# Patient Record
Sex: Female | Born: 1970 | Hispanic: No | Marital: Single | Smoking: Never smoker
Health system: Southern US, Community
[De-identification: ages and names within clinical notes are randomized; demographics above are authoritative.]

## PROBLEM LIST (undated history)

## (undated) DIAGNOSIS — I10 Essential (primary) hypertension: Secondary | ICD-10-CM

## (undated) DIAGNOSIS — IMO0002 Reserved for concepts with insufficient information to code with codable children: Secondary | ICD-10-CM

## (undated) DIAGNOSIS — L409 Psoriasis, unspecified: Secondary | ICD-10-CM

## (undated) DIAGNOSIS — R87619 Unspecified abnormal cytological findings in specimens from cervix uteri: Secondary | ICD-10-CM

## (undated) HISTORY — DX: Essential (primary) hypertension: I10

## (undated) HISTORY — DX: Unspecified abnormal cytological findings in specimens from cervix uteri: R87.619

## (undated) HISTORY — PX: KNEE SURGERY: SHX244

## (undated) HISTORY — PX: CRYOTHERAPY: SHX1416

## (undated) HISTORY — DX: Psoriasis, unspecified: L40.9

## (undated) HISTORY — DX: Reserved for concepts with insufficient information to code with codable children: IMO0002

---

## 2007-05-13 ENCOUNTER — Other Ambulatory Visit: Admission: RE | Admit: 2007-05-13 | Discharge: 2007-05-13 | Payer: Self-pay | Admitting: Internal Medicine

## 2008-05-13 ENCOUNTER — Other Ambulatory Visit: Admission: RE | Admit: 2008-05-13 | Discharge: 2008-05-13 | Payer: Self-pay | Admitting: Obstetrics & Gynecology

## 2013-05-10 ENCOUNTER — Other Ambulatory Visit: Payer: Self-pay | Admitting: Certified Nurse Midwife

## 2013-05-10 NOTE — Telephone Encounter (Signed)
AEX 06/02/13 #[id number] sent through to last pt until next AEX

## 2013-06-01 ENCOUNTER — Encounter: Payer: Self-pay | Admitting: Certified Nurse Midwife

## 2013-06-02 ENCOUNTER — Ambulatory Visit: Payer: Self-pay | Admitting: Certified Nurse Midwife

## 2013-06-02 ENCOUNTER — Encounter: Payer: Self-pay | Admitting: Certified Nurse Midwife

## 2013-06-02 DIAGNOSIS — Z01419 Encounter for gynecological examination (general) (routine) without abnormal findings: Secondary | ICD-10-CM

## 2013-08-03 ENCOUNTER — Encounter (INDEPENDENT_AMBULATORY_CARE_PROVIDER_SITE_OTHER): Payer: BC Managed Care – PPO

## 2013-08-03 DIAGNOSIS — M7989 Other specified soft tissue disorders: Secondary | ICD-10-CM

## 2013-08-03 DIAGNOSIS — M79609 Pain in unspecified limb: Secondary | ICD-10-CM

## 2013-08-09 ENCOUNTER — Other Ambulatory Visit: Payer: Self-pay | Admitting: Certified Nurse Midwife

## 2013-08-10 MED ORDER — NORGESTIM-ETH ESTRAD TRIPHASIC 0.18/0.215/0.25 MG-35 MCG PO TABS: 1.0000 | ORAL_TABLET | Freq: Every day | ORAL | Status: DC

## 2013-08-10 NOTE — Telephone Encounter (Signed)
Patient called back scheduled AEX 08/18/13 @ 9:15 Tri-Previfem #[id number] sent to last patient until AEX

## 2013-08-10 NOTE — Addendum Note (Signed)
Addended by: Lorraine Lax on: 08/10/2013 10:21 AM   Modules accepted: Orders

## 2013-08-10 NOTE — Telephone Encounter (Signed)
Patient needs call, before renewal

## 2013-08-10 NOTE — Telephone Encounter (Signed)
LM on patient's VM that she is due for her AEX and she needs to call us back to schedule.

## 2013-08-10 NOTE — Telephone Encounter (Signed)
Agree 

## 2013-08-10 NOTE — Telephone Encounter (Signed)
Please advise patient D[facility] last AEX appointment 06/02/13, no current AEX scheduled.

## 2013-08-18 ENCOUNTER — Encounter: Payer: Self-pay | Admitting: Certified Nurse Midwife

## 2013-08-18 ENCOUNTER — Ambulatory Visit (INDEPENDENT_AMBULATORY_CARE_PROVIDER_SITE_OTHER): Payer: BC Managed Care – PPO | Admitting: Certified Nurse Midwife

## 2013-08-18 VITALS — BP 112/70 | HR 68 | Resp 16 | Ht 67.5 in | Wt 253.0 lb

## 2013-08-18 DIAGNOSIS — Z01419 Encounter for gynecological examination (general) (routine) without abnormal findings: Secondary | ICD-10-CM

## 2013-08-18 MED ORDER — NORGESTIM-ETH ESTRAD TRIPHASIC 0.18/0.215/0.25 MG-35 MCG PO TABS: 1.0000 | ORAL_TABLET | Freq: Every day | ORAL | Status: DC

## 2013-08-18 NOTE — Patient Instructions (Signed)
General topics  Next pap or exam is  due in 1 year Take a Women's multivitamin Take 1200 mg. of calcium daily - prefer dietary If any concerns in interim to call back  Breast Self-Awareness Practicing breast self-awareness may pick up problems early, prevent significant medical complications, and possibly save your life. By practicing breast self-awareness, you can become familiar with how your breasts look and feel and if your breasts are changing. This allows you to notice changes early. It can also offer you some reassurance that your breast health is good. One way to learn what is normal for your breasts and whether your breasts are changing is to do a breast self-exam. If you find a lump or something that was not present in the past, it is best to contact your caregiver right away. Other findings that should be evaluated by your caregiver include nipple discharge, especially if it is bloody; skin changes or reddening; areas where the skin seems to be pulled in (retracted); or new lumps and bumps. Breast pain is seldom associated with cancer (malignancy), but should also be evaluated by a caregiver. BREAST SELF-EXAM The best time to examine your breasts is 5 7 days after your menstrual period is over.  ExitCare Patient Information 2013 [employer].   Exercise to Stay Healthy Exercise helps you become and stay healthy. EXERCISE IDEAS AND TIPS Choose exercises that:  You enjoy.  Fit into your day. You do not need to exercise really hard to be healthy. You can do exercises at a slow or medium level and stay healthy. You can:  Stretch before and after working out.  Try yoga, Pilates, or tai chi.  Lift weights.  Walk fast, swim, jog, run, climb stairs, bicycle, dance, or rollerskate.  Take aerobic classes. Exercises that burn about 150 calories:  Running 1  miles in 15 minutes.  Playing volleyball for 45 to 60 minutes.  Washing and waxing a car for 45 to 60  minutes.  Playing touch football for 45 minutes.  Walking 1  miles in 35 minutes.  Pushing a stroller 1  miles in 30 minutes.  Playing basketball for 30 minutes.  Raking leaves for 30 minutes.  Bicycling 5 miles in 30 minutes.  Walking 2 miles in 30 minutes.  Dancing for 30 minutes.  Shoveling snow for 15 minutes.  Swimming laps for 20 minutes.  Walking up stairs for 15 minutes.  Bicycling 4 miles in 15 minutes.  Gardening for 30 to 45 minutes.  Jumping rope for 15 minutes.  Washing windows or floors for 45 to 60 minutes. Document Released: 11/23/2010 Document Revised: 01/13/2012 Document Reviewed: 11/23/2010 ExitCare Patient Information 2013 [employer].   Other topics ( that may be useful information):    Sexually Transmitted Disease Sexually transmitted disease (STD) refers to any infection that is passed from person to person during sexual activity. This may happen by way of saliva, semen, blood, vaginal mucus, or urine. Common STDs include:  Gonorrhea.  Chlamydia.  Syphilis.  HIV/AIDS.  Genital herpes.  Hepatitis B and C.  Trichomonas.  Human papillomavirus (HPV).  Pubic lice. CAUSES  An STD may be spread by bacteria, virus, or parasite. A person can get an STD by:  Sexual intercourse with an infected person.  Sharing sex toys with an infected person.  Sharing needles with an infected person.  Having intimate contact with the genitals, mouth, or rectal areas of an infected person. SYMPTOMS  Some people may not have any symptoms, but   they can still pass the infection to others. Different STDs have different symptoms. Symptoms include:  Painful or bloody urination.  Pain in the pelvis, abdomen, vagina, anus, throat, or eyes.  Skin rash, itching, irritation, growths, or sores (lesions). These usually occur in the genital or anal area.  Abnormal vaginal discharge.  Penile discharge in men.  Soft, flesh-colored skin growths in the  genital or anal area.  Fever.  Pain or bleeding during sexual intercourse.  Swollen glands in the groin area.  Yellow skin and eyes (jaundice). This is seen with hepatitis. DIAGNOSIS  To make a diagnosis, your caregiver may:  Take a medical history.  Perform a physical exam.  Take a specimen (culture) to be examined.  Examine a sample of discharge under a microscope.  Perform blood test TREATMENT   Chlamydia, gonorrhea, trichomonas, and syphilis can be cured with antibiotic medicine.  Genital herpes, hepatitis, and HIV can be treated, but not cured, with prescribed medicines. The medicines will lessen the symptoms.  Genital warts from HPV can be treated with medicine or by freezing, burning (electrocautery), or surgery. Warts may come back.  HPV is a virus and cannot be cured with medicine or surgery.However, abnormal areas may be followed very closely by your caregiver and may be removed from the cervix, vagina, or vulva through office procedures or surgery. If your diagnosis is confirmed, your recent sexual partners need treatment. This is true even if they are symptom-free or have a negative culture or evaluation. They should not have sex until their caregiver says it is okay. HOME CARE INSTRUCTIONS  All sexual partners should be informed, tested, and treated for all STDs.  Take your antibiotics as directed. Finish them even if you start to feel better.  Only take over-the-counter or prescription medicines for pain, discomfort, or fever as directed by your caregiver.  Rest.  Eat a balanced diet and drink enough fluids to keep your urine clear or pale yellow.  Do not have sex until treatment is completed and you have followed up with your caregiver. STDs should be checked after treatment.  Keep all follow-up appointments, Pap tests, and blood tests as directed by your caregiver.  Only use latex condoms and water-soluble lubricants during sexual activity. Do not use  petroleum jelly or oils.  Avoid alcohol and illegal drugs.  Get vaccinated for HPV and hepatitis. If you have not received these vaccines in the past, talk to your caregiver about whether one or both might be right for you.  Avoid risky sex practices that can break the skin. The only way to avoid getting an STD is to avoid all sexual activity.Latex condoms and dental dams (for oral sex) will help lessen the risk of getting an STD, but will not completely eliminate the risk. SEEK MEDICAL CARE IF:   You have a fever.  You have any new or worsening symptoms. Document Released: 01/11/2003 Document Revised: 01/13/2012 Document Reviewed: 01/18/2011 ExitCare Patient Information 2013 [employer].    Domestic Abuse You are being battered or abused if someone close to you hits, pushes, or physically hurts you in any way. You also are being abused if you are forced into activities. You are being sexually abused if you are forced to have sexual contact of any kind. You are being emotionally abused if you are made to feel worthless or if you are constantly threatened. It is important to remember that help is available. No one has the right to abuse you. PREVENTION OF FURTHER   ABUSE  Learn the warning signs of danger. This varies with situations but may include: the use of alcohol, threats, isolation from friends and family, or forced sexual contact. Leave if you feel that violence is going to occur.  If you are attacked or beaten, report it to the police so the abuse is documented. You do not have to press charges. The police can protect you while you or the attackers are leaving. Get the officer's name and badge number and a copy of the report.  Find someone you can trust and tell them what is happening to you: your caregiver, a nurse, clergy member, close friend or family member. Feeling ashamed is natural, but remember that you have done nothing wrong. No one deserves abuse. Document Released:  10/18/2000 Document Revised: 01/13/2012 Document Reviewed: 12/27/2010 ExitCare Patient Information 2013 [employer].    How Much is Too Much Alcohol? Drinking too much alcohol can cause injury, accidents, and health problems. These types of problems can include:   Car crashes.  Falls.  Family fighting (domestic violence).  Drowning.  Fights.  Injuries.  Burns.  Damage to certain organs.  Having a baby with birth defects. ONE DRINK CAN BE TOO MUCH WHEN YOU ARE:  Working.  Pregnant or breastfeeding.  Taking medicines. Ask your doctor.  Driving or planning to drive. If you or someone you know has a drinking problem, get help from a doctor.  Document Released: 08/17/2009 Document Revised: 01/13/2012 Document Reviewed: 08/17/2009 ExitCare Patient Information 2013 [employer].   Smoking Hazards Smoking cigarettes is extremely bad for your health. Tobacco smoke has over 200 known poisons in it. There are over 60 chemicals in tobacco smoke that cause cancer. Some of the chemicals found in cigarette smoke include:   Cyanide.  Benzene.  Formaldehyde.  Methanol (wood alcohol).  Acetylene (fuel used in welding torches).  Ammonia. Cigarette smoke also contains the poisonous gases nitrogen oxide and carbon monoxide.  Cigarette smokers have an increased risk of many serious medical problems and Smoking causes approximately:  90% of all lung cancer deaths in men.  80% of all lung cancer deaths in women.  90% of deaths from chronic obstructive lung disease. Compared with nonsmokers, smoking increases the risk of:  Coronary heart disease by 2 to 4 times.  Stroke by 2 to 4 times.  Men developing lung cancer by 23 times.  Women developing lung cancer by 13 times.  Dying from chronic obstructive lung diseases by 12 times.  . Smoking is the most preventable cause of death and disease in our society.  WHY IS SMOKING ADDICTIVE?  Nicotine is the chemical  agent in tobacco that is capable of causing addiction or dependence.  When you smoke and inhale, nicotine is absorbed rapidly into the bloodstream through your lungs. Nicotine absorbed through the lungs is capable of creating a powerful addiction. Both inhaled and non-inhaled nicotine may be addictive.  Addiction studies of cigarettes and spit tobacco show that addiction to nicotine occurs mainly during the teen years, when young people begin using tobacco products. WHAT ARE THE BENEFITS OF QUITTING?  There are many health benefits to quitting smoking.   Likelihood of developing cancer and heart disease decreases. Health improvements are seen almost immediately.  Blood pressure, pulse rate, and breathing patterns start returning to normal soon after quitting. QUITTING SMOKING   American Lung Association - 1-800-LUNGUSA  American Cancer Society - 1-800-ACS-2345 Document Released: 11/28/2004 Document Revised: 01/13/2012 Document Reviewed: 08/02/2009 ExitCare Patient Information 2013 [employer].   Stress Management Stress is a state of physical or mental tension that often results from changes in your life or normal routine. Some common causes of stress are:  Death of a loved one.  Injuries or severe illnesses.  Getting fired or changing jobs.  Moving into a new home. Other causes may be:  Sexual problems.  Business or financial losses.  Taking on a large debt.  Regular conflict with someone at home or at work.  Constant tiredness from lack of sleep. It is not just bad things that are stressful. It may be stressful to:  Win the lottery.  Get married.  Buy a new car. The amount of stress that can be easily tolerated varies from person to person. Changes generally cause stress, regardless of the types of change. Too much stress can affect your health. It may lead to physical or emotional problems. Too little stress (boredom) may also become stressful. SUGGESTIONS TO  REDUCE STRESS:  Talk things over with your family and friends. It often is helpful to share your concerns and worries. If you feel your problem is serious, you may want to get help from a professional counselor.  Consider your problems one at a time instead of lumping them all together. Trying to take care of everything at once may seem impossible. List all the things you need to do and then start with the most important one. Set a goal to accomplish 2 or 3 things each day. If you expect to do too many in a single day you will naturally fail, causing you to feel even more stressed.  Do not use alcohol or drugs to relieve stress. Although you may feel better for a short time, they do not remove the problems that caused the stress. They can also be habit forming.  Exercise regularly - at least 3 times per week. Physical exercise can help to relieve that "uptight" feeling and will relax you.  The shortest distance between despair and hope is often a good night's sleep.  Go to bed and get up on time allowing yourself time for appointments without being rushed.  Take a short "time-out" period from any stressful situation that occurs during the day. Close your eyes and take some deep breaths. Starting with the muscles in your face, tense them, hold it for a few seconds, then relax. Repeat this with the muscles in your neck, shoulders, hand, stomach, back and legs.  Take good care of yourself. Eat a balanced diet and get plenty of rest.  Schedule time for having fun. Take a break from your daily routine to relax. HOME CARE INSTRUCTIONS   Call if you feel overwhelmed by your problems and feel you can no longer manage them on your own.  Return immediately if you feel like hurting yourself or someone else. Document Released: 04/16/2001 Document Revised: 01/13/2012 Document Reviewed: 12/07/2007 ExitCare Patient Information 2013 [employer].   

## 2013-08-18 NOTE — Progress Notes (Signed)
42 y.o. [id number] Single Caucasian Fe here for annual exam. Periods normal, no issues. OCP working well ,desires continuance. No partner change, no STD concerns. Had recent left knee surgery, orthoscopic, in PT now. No health issues today. Sees PCP prn, had labs here last year, all normal   Patient's last menstrual period was 08/04/2013.          Sexually active: yes  The current method of family planning is OCP (estrogen/progesterone).    Exercising: no  exercise Smoker:  no  Health Maintenance: Pap:  05-26-12 neg HPV HR neg MMG:  none Colonoscopy:  none BMD:   none TDaP:  2009 Labs: none Self breast exam: not done   reports that she has never smoked. She does not have any smokeless tobacco history on file. She reports that she does not drink alcohol or use illicit drugs.  Past Medical History  Diagnosis Date  . Abnormal Pap smear     Past Surgical History  Procedure Laterality Date  . Cryotherapy    . Knee surgery Left     Current Outpatient Prescriptions  Medication Sig Dispense Refill  . Norgestimate-Ethinyl Estradiol Triphasic (TRI-PREVIFEM) 0.18/0.215/0.25 MG-35 MCG tablet Take 1 tablet by mouth daily.  28 tablet  0   No current facility-administered medications for this visit.    History reviewed. No pertinent family history.  ROS:  Pertinent items are noted in HPI.  Otherwise, a comprehensive ROS was negative.  Exam:   BP 112/70  Pulse 68  Resp 16  Ht 5' 7.5" (1.715 m)  Wt 253 lb (114.76 kg)  BMI 39.02 kg/m2  LMP 08/04/2013 Height: 5' 7.5" (171.5 cm)  Ht Readings from Last 3 Encounters:  08/18/13 5' 7.5" (1.715 m)    General appearance: alert, cooperative and appears stated age Head: Normocephalic, without obvious abnormality, atraumatic Neck: no adenopathy, supple, symmetrical, trachea midline and thyroid normal to inspection and palpation Lungs: clear to auscultation bilaterally Breasts: normal appearance, no masses or tenderness, No nipple retraction  or dimpling, No nipple discharge or bleeding, No axillary or supraclavicular adenopathy Heart: regular rate and rhythm Abdomen: soft, non-tender; no masses,  no organomegaly Extremities: extremities normal, atraumatic, no cyanosis or edema Skin: Skin color, texture, turgor normal. No rashes or lesions Lymph nodes: Cervical, supraclavicular, and axillary nodes normal. No abnormal inguinal nodes palpated Neurologic: Grossly normal   Pelvic: External genitalia:  no lesions              Urethra:  normal appearing urethra with no masses, tenderness or lesions              Bartholin's and Skene's: normal                 Vagina: normal appearing vagina with normal color and discharge, no lesions              Cervix: normal, non tender              Pap taken: no Bimanual Exam:  Uterus:  normal size, contour, position, consistency, mobility, non-tender and anteverted              Adnexa: normal adnexa and no mass, fullness, tenderness               Rectovaginal: Confirms               Anus:  normal sphincter tone, no lesions  A:  Well Woman with normal exam  Contraception OCP desired  Recent L knee  surgery under follow up  P:   Reviewed health and wellness pertinent to exam  Rx Triprevifem see order  Continue follow up as desired  Pap smear as per guidelines   Mammogram yearly, given information to schedule pap smear not taken today  counseled on breast self exam, mammography screening, STD prevention, use and side effects of OCP's, adequate intake of calcium and vitamin D, diet and exercise  return annually or prn  An After Visit Summary was printed and given to the patient.

## 2013-08-19 NOTE — Progress Notes (Signed)
Note reviewed, agree with plan.  Naethan Bracewell, MD  

## 2013-09-07 ENCOUNTER — Telehealth: Payer: Self-pay | Admitting: Certified Nurse Midwife

## 2013-09-07 NOTE — Telephone Encounter (Signed)
Spoke with [facility] about pt. BCP refills and if they received Rx  from 08/2013 Yes Rx was received. T/C to pt left a voice message telling her that they did get her new Rx  And will have it ready for pick up.

## 2013-09-07 NOTE — Telephone Encounter (Signed)
pt is requesting  refills for her B/C. please call this into [email or web address] pt also states she was told this was going to be called in when she came in for her last visit

## 2014-04-21 DIAGNOSIS — J309 Allergic rhinitis, unspecified: Secondary | ICD-10-CM | POA: Insufficient documentation

## 2014-04-21 DIAGNOSIS — J45909 Unspecified asthma, uncomplicated: Secondary | ICD-10-CM | POA: Insufficient documentation

## 2014-09-05 ENCOUNTER — Encounter: Payer: Self-pay | Admitting: Certified Nurse Midwife

## 2014-10-18 ENCOUNTER — Other Ambulatory Visit: Payer: Self-pay | Admitting: Certified Nurse Midwife

## 2014-10-18 MED ORDER — NORGESTIM-ETH ESTRAD TRIPHASIC 0.18/0.215/0.25 MG-35 MCG PO TABS: 1.0000 | ORAL_TABLET | Freq: Every day | ORAL | Status: DC

## 2014-10-18 NOTE — Telephone Encounter (Signed)
[id number] - [place], [place] - [address] Patient is requesting refills until her next aex 12/15/14 with Sara Chuebbie Leonard,

## 2014-10-18 NOTE — Telephone Encounter (Signed)
Left Message To Call Back  

## 2014-10-18 NOTE — Telephone Encounter (Signed)
Will refill but patient should schedule mammogram prior to aex

## 2014-10-18 NOTE — Telephone Encounter (Signed)
Medication refill request: Tri-Previfem Last AEX:  08/18/13 with [name] Next AEX: 12/15/14 with [name] Last MMG (if hormonal medication request): Never had one  Refill authorized: #3 packs/0 rfs, please advise.

## 2014-10-20 NOTE — Telephone Encounter (Signed)
Left Message To Call Back  

## 2014-10-24 NOTE — Telephone Encounter (Signed)
Patient notified aware that she needs to schedule mammogram prior to AEX, offered the names of mammogram facilities that we send patients to she said she will look them up.

## 2014-10-29 ENCOUNTER — Other Ambulatory Visit: Payer: Self-pay | Admitting: Certified Nurse Midwife

## 2014-12-15 ENCOUNTER — Encounter: Payer: Self-pay | Admitting: Certified Nurse Midwife

## 2014-12-15 ENCOUNTER — Ambulatory Visit (INDEPENDENT_AMBULATORY_CARE_PROVIDER_SITE_OTHER): Payer: BLUE CROSS/BLUE SHIELD | Admitting: Certified Nurse Midwife

## 2014-12-15 VITALS — BP 110/64 | HR 70 | Resp 16 | Ht 67.25 in | Wt 271.0 lb

## 2014-12-15 DIAGNOSIS — Z3041 Encounter for surveillance of contraceptive pills: Secondary | ICD-10-CM

## 2014-12-15 DIAGNOSIS — N841 Polyp of cervix uteri: Secondary | ICD-10-CM

## 2014-12-15 DIAGNOSIS — Z0000 Encounter for general adult medical examination without abnormal findings: Secondary | ICD-10-CM

## 2014-12-15 DIAGNOSIS — Z124 Encounter for screening for malignant neoplasm of cervix: Secondary | ICD-10-CM

## 2014-12-15 DIAGNOSIS — Z01419 Encounter for gynecological examination (general) (routine) without abnormal findings: Secondary | ICD-10-CM

## 2014-12-15 LAB — COMPREHENSIVE METABOLIC PANEL
ALT: 10 U/L (ref 0–35)
AST: 12 U/L (ref 0–37)
Albumin: 4 g/dL (ref 3.5–5.2)
Alkaline Phosphatase: 107 U/L (ref 39–117)
BILIRUBIN TOTAL: 0.5 mg/dL (ref 0.2–1.2)
BUN: 8 mg/dL (ref 6–23)
CO2: 24 meq/L (ref 19–32)
CREATININE: 0.66 mg/dL (ref 0.50–1.10)
Calcium: 8.9 mg/dL (ref 8.4–10.5)
Chloride: 106 mEq/L (ref 96–112)
Glucose, Bld: 95 mg/dL (ref 70–99)
Potassium: 3.8 mEq/L (ref 3.5–5.3)
Sodium: 140 mEq/L (ref 135–145)
Total Protein: 6.8 g/dL (ref 6.0–8.3)

## 2014-12-15 LAB — CBC
HEMATOCRIT: 40.9 % (ref 36.0–46.0)
Hemoglobin: 13.5 g/dL (ref 12.0–15.0)
MCH: 27.7 pg (ref 26.0–34.0)
MCHC: 33 g/dL (ref 30.0–36.0)
MCV: 84 fL (ref 78.0–100.0)
MPV: 11.8 fL (ref 8.6–12.4)
PLATELETS: 318 10*3/uL (ref 150–400)
RBC: 4.87 MIL/uL (ref 3.87–5.11)
RDW: 14.2 % (ref 11.5–15.5)
WBC: 9.6 10*3/uL (ref 4.0–10.5)

## 2014-12-15 LAB — LIPID PANEL
CHOL/HDL RATIO: 2.3 ratio
CHOLESTEROL: 161 mg/dL (ref 0–200)
HDL: 70 mg/dL (ref >39)
LDL Cholesterol: 79 mg/dL (ref 0–99)
TRIGLYCERIDES: 60 mg/dL (ref <150)
VLDL: 12 mg/dL (ref 0–40)

## 2014-12-15 MED ORDER — NORGESTIM-ETH ESTRAD TRIPHASIC 0.18/0.215/0.25 MG-35 MCG PO TABS: 1.0000 | ORAL_TABLET | Freq: Every day | ORAL | Status: DC

## 2014-12-15 NOTE — Progress Notes (Signed)
44 y.o. [id number] Single  Caucasian Fe here for annual exam. Periods normal, no issues. Contraception working well. No STD concerns or testing needed. Sees Urgent care if needed. Patient aware of increase weight gain and is planning to start working on again. Sees Urgent care if needed. No health concerns today. Aware mammogram encouraged, just did not go have done. Will plan to schedule. No health issues today.  Patient's last menstrual period was 11/24/2014.          Sexually active: Yes.    The current method of family planning is OCP (estrogen/progesterone).    Exercising: No.  exercise Smoker:  no  Health Maintenance: Pap:  05-26-12 neg HPV HR neg MMG:  none Colonoscopy:  none BMD:   none TDaP:  2009 Labs: none Self breast exam: not done   reports that she has never smoked. She does not have any smokeless tobacco history on file. She reports that she does not drink alcohol or use illicit drugs.  Past Medical History  Diagnosis Date  . Abnormal Pap smear     Past Surgical History  Procedure Laterality Date  . Cryotherapy    . Knee surgery Left     Current Outpatient Prescriptions  Medication Sig Dispense Refill  . Norgestimate-Ethinyl Estradiol Triphasic (TRI-PREVIFEM) 0.18/0.215/0.25 MG-35 MCG tablet Take 1 tablet by mouth daily. 3 Package 0   No current facility-administered medications for this visit.    History reviewed. No pertinent family history.  ROS:  Pertinent items are noted in HPI.  Otherwise, a comprehensive ROS was negative.  Exam:   BP 110/64 mmHg  Pulse 70  Resp 16  Ht 5' 7.25" (1.708 m)  Wt 271 lb (122.925 kg)  BMI 42.14 kg/m2  LMP 11/24/2014 Height: 5' 7.25" (170.8 cm) Ht Readings from Last 3 Encounters:  12/15/14 5' 7.25" (1.708 m)  08/18/13 5' 7.5" (1.715 m)    General appearance: alert, cooperative and appears stated age Head: Normocephalic, without obvious abnormality, atraumatic Neck: no adenopathy, supple, symmetrical, trachea midline  and thyroid normal to inspection and palpation Lungs: clear to auscultation bilaterally Breasts: normal appearance, no masses or tenderness, No nipple retraction or dimpling, No nipple discharge or bleeding, No axillary or supraclavicular adenopathy Heart: regular rate and rhythm Abdomen: soft, non-tender; no masses,  no organomegaly Extremities: extremities normal, atraumatic, no cyanosis or edema Skin: Skin color, texture, turgor normal. No rashes or lesions Lymph nodes: Cervical, supraclavicular, and axillary nodes normal. No abnormal inguinal nodes palpated Neurologic: Grossly normal   Pelvic: External genitalia:  no lesions              Urethra:  normal appearing urethra with no masses, tenderness or lesions              Bartholin's and Skene's: normal                 Vagina: normal appearing vagina with normal color and discharge, no lesions              Cervix: normal, non tender, cervical polyp in center of cervix, bleeding with pap polyp size of dime              Pap taken: Yes.   Bimanual Exam:  Uterus:  normal size, contour, position, consistency, mobility, non-tender              Adnexa: normal adnexa and no mass, fullness, tenderness  Rectovaginal: Confirms               Anus:  normal sphincter tone, no lesions  Chaperone present: Yes  A:  Well Woman with normal exam  Contraception OCP desired  Cervical Polyp  Mammogram due  Obesity  Screening labs  P:   Reviewed health and wellness pertinent to exam  Rx Triprevifem see order  Discussed findings of cervical polyp and benign finding, but pap smear was also obtained from that area too. Discussed if has spotting or bleeding will need to evaluate, which maybe coming from polyp and can be removed. Patient does not want to remove unless needed. Will advise if above occurs  Discussed weight loss to improve health, and decrease risk of health issues such as hypertension, diabetes,and cancer. Patient plans to work  on portion control and will Think about exercise.  Labs: CMP,Lipid panel, CBC, TSH  Pap smear taken today with HPVHR   counseled on breast self exam, stressed  mammography  Screening given information to schedule, adequate intake of calcium and vitamin D, diet and exercise return annually or prn  An After Visit Summary was printed and given to the patient.

## 2014-12-15 NOTE — Patient Instructions (Signed)

## 2014-12-16 LAB — TSH: TSH: 1.315 u[IU]/mL (ref 0.350–4.500)

## 2014-12-16 NOTE — Progress Notes (Signed)
Reviewed personally.  M. Suzanne Makyla Bye, MD.  

## 2014-12-19 LAB — IPS PAP TEST WITH HPV

## 2015-01-24 ENCOUNTER — Telehealth: Payer: Self-pay | Admitting: Certified Nurse Midwife

## 2015-01-24 DIAGNOSIS — Z3041 Encounter for surveillance of contraceptive pills: Secondary | ICD-10-CM

## 2015-01-24 MED ORDER — NORGESTIM-ETH ESTRAD TRIPHASIC 0.18/0.215/0.25 MG-35 MCG PO TABS: 1.0000 | ORAL_TABLET | Freq: Every day | ORAL | Status: DC

## 2015-01-24 NOTE — Telephone Encounter (Signed)
Spoke with patient. Patient states that she has spoken with the pharmacy regarding OCP and they do not have new rx. Advised will resend rx for Triprevifem #3 4RF at this time. Patient is agreeable. Advised patient of results as seen below. Patient is agreeable.  Notes Recorded by Araceli Boucheavina Johnson, CMA on 12/19/2014 at 11:23 AM aex is 2/17 Notes Recorded by Verner Choleborah S Leonard, CNM on 12/19/2014 at 10:56 AM Pap smear reviewed negative HPVHR not detected 02  Routing to provider for final review. Patient agreeable to disposition. Will close encounter

## 2015-01-24 NOTE — Telephone Encounter (Signed)
Pt says her birth control was never called in and she never received results of pap test.

## 2015-12-19 ENCOUNTER — Ambulatory Visit: Payer: BLUE CROSS/BLUE SHIELD | Admitting: Certified Nurse Midwife

## 2016-03-06 ENCOUNTER — Encounter: Payer: Self-pay | Admitting: Certified Nurse Midwife

## 2016-03-06 ENCOUNTER — Ambulatory Visit (INDEPENDENT_AMBULATORY_CARE_PROVIDER_SITE_OTHER): Payer: BLUE CROSS/BLUE SHIELD | Admitting: Certified Nurse Midwife

## 2016-03-06 ENCOUNTER — Other Ambulatory Visit: Payer: Self-pay | Admitting: Certified Nurse Midwife

## 2016-03-06 VITALS — BP 114/80 | HR 74 | Resp 16 | Ht 67.25 in | Wt 279.0 lb

## 2016-03-06 DIAGNOSIS — Z01419 Encounter for gynecological examination (general) (routine) without abnormal findings: Secondary | ICD-10-CM

## 2016-03-06 DIAGNOSIS — Z3041 Encounter for surveillance of contraceptive pills: Secondary | ICD-10-CM

## 2016-03-06 DIAGNOSIS — N841 Polyp of cervix uteri: Secondary | ICD-10-CM | POA: Diagnosis not present

## 2016-03-06 DIAGNOSIS — Z0000 Encounter for general adult medical examination without abnormal findings: Secondary | ICD-10-CM

## 2016-03-06 DIAGNOSIS — Z1231 Encounter for screening mammogram for malignant neoplasm of breast: Secondary | ICD-10-CM

## 2016-03-06 DIAGNOSIS — Z124 Encounter for screening for malignant neoplasm of cervix: Secondary | ICD-10-CM

## 2016-03-06 LAB — POCT URINALYSIS DIPSTICK
Bilirubin, UA: NEGATIVE
GLUCOSE UA: NEGATIVE
KETONES UA: NEGATIVE
Leukocytes, UA: NEGATIVE
Nitrite, UA: NEGATIVE
Protein, UA: NEGATIVE
RBC UA: NEGATIVE
UROBILINOGEN UA: NEGATIVE
pH, UA: 6

## 2016-03-06 MED ORDER — NORGESTIM-ETH ESTRAD TRIPHASIC 0.18/0.215/0.25 MG-35 MCG PO TABS: 1.0000 | ORAL_TABLET | Freq: Every day | ORAL | Status: DC

## 2016-03-06 NOTE — Progress Notes (Signed)
45 y.o. [id number] Single  Caucasian Fe here for annual exam. Periods normal no issues. Feels she has gained weight in the last month with vacation and is working on weight loss. Sees Urgent care if needed, recently for allergies and cough. Had screening labs last year all normal here. Declines labs today. Busy with work. Contraception working well, desires continuance. Patient still not have mammogram this year. "Just forgot". No health concerns today.   Patient's last menstrual period was 02/14/2016.          Sexually active: Yes.    The current method of family planning is OCP (estrogen/progesterone).    Exercising: No.  exercise Smoker:  no  Health Maintenance: Pap:  12-15-14 neg HPV HR neg MMG:  none Colonoscopy:  none BMD:   none TDaP:  2009 Shingles: no Pneumonia: no Hep C and HIV: not done Labs: poct urine-neg Self breast exam: not done   reports that she has never smoked. She does not have any smokeless tobacco history on file. She reports that she does not drink alcohol or use illicit drugs.  Past Medical History  Diagnosis Date  . Abnormal Pap smear     Past Surgical History  Procedure Laterality Date  . Cryotherapy    . Knee surgery Left     Current Outpatient Prescriptions  Medication Sig Dispense Refill  . Norgestimate-Ethinyl Estradiol Triphasic (TRI-PREVIFEM) 0.18/0.215/0.25 MG-35 MCG tablet Take 1 tablet by mouth daily. 3 Package 4   No current facility-administered medications for this visit.    History reviewed. No pertinent family history.  ROS:  Pertinent items are noted in HPI.  Otherwise, a comprehensive ROS was negative.  Exam:   BP 114/80 mmHg  Pulse 74  Resp 16  Ht 5' 7.25" (1.708 m)  Wt 279 lb (126.554 kg)  BMI 43.38 kg/m2  LMP 02/14/2016 Height: 5' 7.25" (170.8 cm) Ht Readings from Last 3 Encounters:  03/06/16 5' 7.25" (1.708 m)  12/15/14 5' 7.25" (1.708 m)  08/18/13 5' 7.5" (1.715 m)    General appearance: alert, cooperative and  appears stated age Head: Normocephalic, without obvious abnormality, atraumatic Neck: no adenopathy, supple, symmetrical, trachea midline and thyroid normal to inspection and palpation Lungs: clear to auscultation bilaterally Breasts: normal appearance, no masses or tenderness, No nipple retraction or dimpling, No nipple discharge or bleeding, No axillary or supraclavicular adenopathy Heart: regular rate and rhythm Abdomen: soft, non-tender; no masses,  no organomegaly Extremities: extremities normal, atraumatic, no cyanosis or edema Skin: Skin color, texture, turgor normal. No rashes or lesions Lymph nodes: Cervical, supraclavicular, and axillary nodes normal. No abnormal inguinal nodes palpated Neurologic: Grossly normal   Pelvic: External genitalia:  no lesions              Urethra:  normal appearing urethra with no masses, tenderness or lesions              Bartholin's and Skene's: normal                 Vagina: normal appearing vagina with normal color and discharge, no lesions              Cervix:normal appearance with known cervical polyp ? Size change approximately 2 cm. Bleeding with obtaining pap only.,  Non  Tender.              Pap taken: Yes.   Bimanual Exam:  Uterus:  normal size, contour, position, consistency, mobility, non-tender  Adnexa: normal adnexa, no mass, fullness, tenderness and limited by body habitus, no large masses noted               Rectovaginal: Confirms               Anus:  normal sphincter tone, no lesions  Chaperone present: yes  A:  Well Woman with normal exam  Contraception OCP desired  Cervical polyp known slight size change  Mammogram overdue, will schedule for patient prior to leaving  Obesity  P:   Reviewed health and wellness pertinent to exam  Rx  Tri-Previfem see order, one refill until mammogram report in   Discussed warning signs of polyp and need to advise if has spotting or bleeding other than period. Reviewed etiology  again and questions addressed. Discussed removal to prevent spotting, patient does not want to remove unless needed.  Discussed weight loss to increase health benefits. Start with portion control and reduction of boxed, prepared foods and work on fresh vegetables, lean meats and decrease bread consumption. Will refer for dietary counseling, patient declines.  Pap smear as above with HPV reflex   counseled on breast self exam, mammography screening, use and side effects of OCP's, adequate intake of calcium and vitamin D, diet and exercise  return annually or prn  An After Visit Summary was printed and given to the patient.

## 2016-03-06 NOTE — Patient Instructions (Signed)
EXERCISE AND DIET:  We recommended that you start or continue a regular exercise program for good health. Regular exercise means any activity that makes your heart beat faster and makes you sweat.  We recommend exercising at least 30 minutes per day at least 3 days a week, preferably 4 or 5.  We also recommend a diet low in fat and sugar.  Inactivity, poor dietary choices and obesity can cause diabetes, heart attack, stroke, and kidney damage, among others.    ALCOHOL AND SMOKING:  Women should limit their alcohol intake to no more than 7 drinks/beers/glasses of wine (combined, not each!) per week. Moderation of alcohol intake to this level decreases your risk of breast cancer and liver damage. And of course, no recreational drugs are part of a healthy lifestyle.  And absolutely no smoking or even second hand smoke. Most people know smoking can cause heart and lung diseases, but did you know it also contributes to weakening of your bones? Aging of your skin?  Yellowing of your teeth and nails?  CALCIUM AND VITAMIN D:  Adequate intake of calcium and Vitamin D are recommended.  The recommendations for exact amounts of these supplements seem to change often, but generally speaking 600 mg of calcium (either carbonate or citrate) and 800 units of Vitamin D per day seems prudent. Certain women may benefit from higher intake of Vitamin D.  If you are among these women, your doctor will have told you during your visit.    PAP SMEARS:  Pap smears, to check for cervical cancer or precancers,  have traditionally been done yearly, although recent scientific advances have shown that most women can have pap smears less often.  However, every woman still should have a physical exam from her gynecologist every year. It will include a breast check, inspection of the vulva and vagina to check for abnormal growths or skin changes, a visual exam of the cervix, and then an exam to evaluate the size and shape of the uterus and  ovaries.  And after 45 years of age, a rectal exam is indicated to check for rectal cancers. We will also provide age appropriate advice regarding health maintenance, like when you should have certain vaccines, screening for sexually transmitted diseases, bone density testing, colonoscopy, mammograms, etc.   MAMMOGRAMS:  All women over 40 years old should have a yearly mammogram. Many facilities now offer a "3D" mammogram, which may cost around $50 extra out of pocket. If possible,  we recommend you accept the option to have the 3D mammogram performed.  It both reduces the number of women who will be called back for extra views which then turn out to be normal, and it is better than the routine mammogram at detecting truly abnormal areas.    COLONOSCOPY:  Colonoscopy to screen for colon cancer is recommended for all women at age 50.  We know, you hate the idea of the prep.  We agree, BUT, having colon cancer and not knowing it is worse!!  Colon cancer so often starts as a polyp that can be seen and removed at colonscopy, which can quite literally save your life!  And if your first colonoscopy is normal and you have no family history of colon cancer, most women don't have to have it again for 10 years.  Once every ten years, you can do something that may end up saving your life, right?  We will be happy to help you get it scheduled when you are ready.    Be sure to check your insurance coverage so you understand how much it will cost.  It may be covered as a preventative service at no cost, but you should check your particular policy.     Calorie Counting for Weight Loss Calories are energy you get from the things you eat and drink. Your body uses this energy to keep you going throughout the day. The number of calories you eat affects your weight. When you eat more calories than your body needs, your body stores the extra calories as fat. When you eat fewer calories than your body needs, your body burns fat to  get the energy it needs. Calorie counting means keeping track of how many calories you eat and drink each day. If you make sure to eat fewer calories than your body needs, you should lose weight. In order for calorie counting to work, you will need to eat the number of calories that are right for you in a day to lose a healthy amount of weight per week. A healthy amount of weight to lose per week is usually 1-2 lb (0.5-0.9 kg). A dietitian can determine how many calories you need in a day and give you suggestions on how to reach your calorie goal.  WHAT IS MY MY PLAN? My goal is to have __________ calories per day.  If I have this many calories per day, I should lose around __________ pounds per week. WHAT DO I NEED TO KNOW ABOUT CALORIE COUNTING? In order to meet your daily calorie goal, you will need to:  Find out how many calories are in each food you would like to eat. Try to do this before you eat.  Decide how much of the food you can eat.  Write down what you ate and how many calories it had. Doing this is called keeping a food log. WHERE DO I FIND CALORIE INFORMATION? The number of calories in a food can be found on a Nutrition Facts label. Note that all the information on a label is based on a specific serving of the food. If a food does not have a Nutrition Facts label, try to look up the calories online or ask your dietitian for help. HOW DO I DECIDE HOW MUCH TO EAT? To decide how much of the food you can eat, you will need to consider both the number of calories in one serving and the size of one serving. This information can be found on the Nutrition Facts label. If a food does not have a Nutrition Facts label, look up the information online or ask your dietitian for help. Remember that calories are listed per serving. If you choose to have more than one serving of a food, you will have to multiply the calories per serving by the amount of servings you plan to eat. For example, the label  on a package of bread might say that a serving size is 1 slice and that there are 90 calories in a serving. If you eat 1 slice, you will have eaten 90 calories. If you eat 2 slices, you will have eaten 180 calories. HOW DO I KEEP A FOOD LOG? After each meal, record the following information in your food log:  What you ate.  How much of it you ate.  How many calories it had.  Then, add up your calories. Keep your food log near you, such as in a small notebook in your pocket. Another option is to use a mobile app   or website. Some programs will calculate calories for you and show you how many calories you have left each time you add an item to the log. WHAT ARE SOME CALORIE COUNTING TIPS?  Use your calories on foods and drinks that will fill you up and not leave you hungry. Some examples of this include foods like nuts and nut butters, vegetables, lean proteins, and high-fiber foods (more than 5 g fiber per serving).  Eat nutritious foods and avoid empty calories. Empty calories are calories you get from foods or beverages that do not have many nutrients, such as candy and soda. It is better to have a nutritious high-calorie food (such as an avocado) than a food with few nutrients (such as a bag of chips).  Know how many calories are in the foods you eat most often. This way, you do not have to look up how many calories they have each time you eat them.  Look out for foods that may seem like low-calorie foods but are really high-calorie foods, such as baked goods, soda, and fat-free candy.  Pay attention to calories in drinks. Drinks such as sodas, specialty coffee drinks, alcohol, and juices have a lot of calories yet do not fill you up. Choose low-calorie drinks like water and diet drinks.  Focus your calorie counting efforts on higher calorie items. Logging the calories in a garden salad that contains only vegetables is less important than calculating the calories in a milk shake.  Find a  way of tracking calories that works for you. Get creative. Most people who are successful find ways to keep track of how much they eat in a day, even if they do not count every calorie. WHAT ARE SOME PORTION CONTROL TIPS?  Know how many calories are in a serving. This will help you know how many servings of a certain food you can have.  Use a measuring cup to measure serving sizes. This is helpful when you start out. With time, you will be able to estimate serving sizes for some foods.  Take some time to put servings of different foods on your favorite plates, bowls, and cups so you know what a serving looks like.  Try not to eat straight from a bag or box. Doing this can lead to overeating. Put the amount you would like to eat in a cup or on a plate to make sure you are eating the right portion.  Use smaller plates, glasses, and bowls to prevent overeating. This is a quick and easy way to practice portion control. If your plate is smaller, less food can fit on it.  Try not to multitask while eating, such as watching TV or using your computer. If it is time to eat, sit down at a table and enjoy your food. Doing this will help you to start recognizing when you are full. It will also make you more aware of what and how much you are eating. HOW CAN I CALORIE COUNT WHEN EATING OUT?  Ask for smaller portion sizes or child-sized portions.  Consider sharing an entree and sides instead of getting your own entree.  If you get your own entree, eat only half. Ask for a box at the beginning of your meal and put the rest of your entree in it so you are not tempted to eat it.  Look for the calories on the menu. If calories are listed, choose the lower calorie options.  Choose dishes that include vegetables, fruits, whole grains,   low-fat dairy products, and lean protein. Focusing on smart food choices from each of the 5 food groups can help you stay on track at restaurants.  Choose items that are boiled,  broiled, grilled, or steamed.  Choose water, milk, unsweetened iced tea, or other drinks without added sugars. If you want an alcoholic beverage, choose a lower calorie option. For example, a regular margarita can have up to 700 calories and a glass of wine has around 150.  Stay away from items that are buttered, battered, fried, or served with cream sauce. Items labeled "crispy" are usually fried, unless stated otherwise.  Ask for dressings, sauces, and syrups on the side. These are usually very high in calories, so do not eat much of them.  Watch out for salads. Many people think salads are a healthy option, but this is often not the case. Many salads come with bacon, fried chicken, lots of cheese, fried chips, and dressing. All of these items have a lot of calories. If you want a salad, choose a garden salad and ask for grilled meats or steak. Ask for the dressing on the side, or ask for olive oil and vinegar or lemon to use as dressing.  Estimate how many servings of a food you are given. For example, a serving of cooked rice is  cup or about the size of half a tennis ball or one cupcake wrapper. Knowing serving sizes will help you be aware of how much food you are eating at restaurants. The list below tells you how big or small some common portion sizes are based on everyday objects.  1 oz--4 stacked dice.  3 oz--1 deck of cards.  1 tsp--1 dice.  1 Tbsp-- a Ping-Pong ball.  2 Tbsp--1 Ping-Pong ball.   cup--1 tennis ball or 1 cupcake wrapper.  1 cup--1 baseball.   This information is not intended to replace advice given to you by your health care provider. Make sure you discuss any questions you have with your health care provider.   Document Released: 10/21/2005 Document Revised: 11/11/2014 Document Reviewed: 08/26/2013 Elsevier Interactive Patient Education 2016 [employer]. Exercising to [employer] Exercising can help you to lose weight. In order to lose weight through  exercise, you need to do vigorous-intensity exercise. You can tell that you are exercising with vigorous intensity if you are breathing very hard and fast and cannot hold a conversation while exercising. Moderate-intensity exercise helps to maintain your current weight. You can tell that you are exercising at a moderate level if you have a higher heart rate and faster breathing, but you are still able to hold a conversation. HOW OFTEN SHOULD I EXERCISE? Choose an activity that you enjoy and set realistic goals. Your health care provider can help you to make an activity plan that works for you. Exercise regularly as directed by your health care provider. This may include:  Doing resistance training twice each week, such as:  Push-ups.  Sit-ups.  Lifting weights.  Using resistance bands.  Doing a given intensity of exercise for a given amount of time. Choose from these options:  150 minutes of moderate-intensity exercise every week.  75 minutes of vigorous-intensity exercise every week.  A mix of moderate-intensity and vigorous-intensity exercise every week. Children, pregnant women, people who are out of shape, people who are overweight, and older adults may need to consult a health care provider for individual recommendations. If you have any sort of medical condition, be sure to consult your health care  provider before starting a new exercise program. WHAT ARE SOME ACTIVITIES THAT CAN HELP ME TO LOSE WEIGHT?   Walking at a rate of at least 4.5 miles an hour.  Jogging or running at a rate of 5 miles per hour.  Biking at a rate of at least 10 miles per hour.  Lap swimming.  Roller-skating or in-line skating.  Cross-country skiing.  Vigorous competitive sports, such as football, basketball, and soccer.  Jumping rope.  Aerobic dancing. HOW CAN I BE MORE ACTIVE IN MY DAY-TO-DAY ACTIVITIES?  Use the stairs instead of the elevator.  Take a walk during your lunch break.  If  you drive, park your car farther away from work or school.  If you take public transportation, get off one stop early and walk the rest of the way.  Make all of your phone calls while standing up and walking around.  Get up, stretch, and walk around every 30 minutes throughout the day. WHAT GUIDELINES SHOULD I FOLLOW WHILE EXERCISING?  Do not exercise so much that you hurt yourself, feel dizzy, or get very short of breath.  Consult your health care provider prior to starting a new exercise program.  Wear comfortable clothes and shoes with good support.  Drink plenty of water while you exercise to prevent dehydration or heat stroke. Body water is lost during exercise and must be replaced.  Work out until you breathe faster and your heart beats faster.   This information is not intended to replace advice given to you by your health care provider. Make sure you discuss any questions you have with your health care provider.   Document Released: 11/23/2010 Document Revised: 11/11/2014 Document Reviewed: 03/24/2014 Elsevier Interactive Patient Education [employer]2016 [employer] Inc.

## 2016-03-06 NOTE — Progress Notes (Signed)
Scheduled patient for 3D screening mammogram [facility] 03[facility]14/2016 at 3:10 pm. She is agreeable to date and time.

## 2016-03-11 NOTE — Progress Notes (Signed)
Reviewed personally.  M. Suzanne Corleone Biegler, MD.  

## 2016-03-12 ENCOUNTER — Other Ambulatory Visit: Payer: Self-pay | Admitting: Certified Nurse Midwife

## 2016-03-14 ENCOUNTER — Ambulatory Visit: Payer: Self-pay

## 2016-03-14 LAB — IPS PAP TEST WITH REFLEX TO HPV

## 2016-05-22 ENCOUNTER — Ambulatory Visit
Admission: RE | Admit: 2016-05-22 | Discharge: 2016-05-22 | Disposition: A | Discharge status: Home / Self Care | Payer: BLUE CROSS/BLUE SHIELD | Source: Ambulatory Visit | Attending: Certified Nurse Midwife | Admitting: Certified Nurse Midwife

## 2016-05-22 DIAGNOSIS — Z1231 Encounter for screening mammogram for malignant neoplasm of breast: Secondary | ICD-10-CM

## 2016-05-24 ENCOUNTER — Other Ambulatory Visit: Payer: Self-pay | Admitting: Certified Nurse Midwife

## 2016-05-24 DIAGNOSIS — R928 Other abnormal and inconclusive findings on diagnostic imaging of breast: Secondary | ICD-10-CM

## 2016-05-31 ENCOUNTER — Ambulatory Visit
Admission: RE | Admit: 2016-05-31 | Discharge: 2016-05-31 | Disposition: A | Discharge status: Home / Self Care | Payer: BLUE CROSS/BLUE SHIELD | Source: Ambulatory Visit | Attending: Certified Nurse Midwife | Admitting: Certified Nurse Midwife

## 2016-05-31 DIAGNOSIS — R928 Other abnormal and inconclusive findings on diagnostic imaging of breast: Secondary | ICD-10-CM

## 2016-06-04 ENCOUNTER — Telehealth: Payer: Self-pay | Admitting: Certified Nurse Midwife

## 2016-06-04 DIAGNOSIS — Z3041 Encounter for surveillance of contraceptive pills: Secondary | ICD-10-CM

## 2016-06-04 NOTE — Telephone Encounter (Signed)
Patient calling for refill of her birth control sent to [facility] in [place] at [phone]. She has gotten her mammogram done.

## 2016-06-04 NOTE — Telephone Encounter (Signed)
Patient was seen for her aex on 03/06/2016. Patient had a screening mammogram on 05/22/2016. She was seen for a left breast diagnostic mammogram with ultrasound on 05/31/2016 for evaluation of a left breast mass seen on her screening mammogram. All results are available in [facility]. Imaging from 05/31/2016 returned negative. Patient is requesting a refill on her Triprevifem OCP. Routing to [employer] CNM for review and advise.

## 2016-06-05 ENCOUNTER — Other Ambulatory Visit: Payer: Self-pay | Admitting: Emergency Medicine

## 2016-06-05 DIAGNOSIS — Z3041 Encounter for surveillance of contraceptive pills: Secondary | ICD-10-CM

## 2016-06-05 MED ORDER — NORGESTIM-ETH ESTRAD TRIPHASIC 0.18/0.215/0.25 MG-35 MCG PO TABS: 1.0000 | ORAL_TABLET | Freq: Every day | ORAL | 2 refills | Status: DC

## 2016-06-05 NOTE — Telephone Encounter (Signed)
Reviewed results and will renew OCP.

## 2016-06-05 NOTE — Telephone Encounter (Signed)
Spoke with patient. Advised Katherine Murphy CNM has sent in refills of her Triprevifem OCP to the pharmacy on file. She is agreeable and verbalizes understanding.  Routing to provider for final review. Patient agreeable to disposition. Will close encounter.

## 2016-06-05 NOTE — Telephone Encounter (Signed)
Left message to call Kaitlyn at [phone]. 

## 2017-02-12 ENCOUNTER — Other Ambulatory Visit: Payer: Self-pay | Admitting: Certified Nurse Midwife

## 2017-02-12 DIAGNOSIS — Z3041 Encounter for surveillance of contraceptive pills: Secondary | ICD-10-CM

## 2017-02-12 NOTE — Telephone Encounter (Signed)
Medication refill request: OCP Last AEX:  03/06/16 DL Next AEX: 07/05/46 DL Last MMG (if hormonal medication request): [place] left 05/31/16 [id number]:benign  Refill authorized: 06/05/16 #3packs/3R. Todat #1pack/0R?

## 2017-03-07 ENCOUNTER — Ambulatory Visit (INDEPENDENT_AMBULATORY_CARE_PROVIDER_SITE_OTHER): Payer: BLUE CROSS/BLUE SHIELD | Admitting: Certified Nurse Midwife

## 2017-03-07 ENCOUNTER — Encounter: Payer: Self-pay | Admitting: Certified Nurse Midwife

## 2017-03-07 VITALS — BP 120/78 | HR 72 | Resp 20 | Ht 67.24 in | Wt 250.2 lb

## 2017-03-07 DIAGNOSIS — Z01419 Encounter for gynecological examination (general) (routine) without abnormal findings: Secondary | ICD-10-CM

## 2017-03-07 DIAGNOSIS — Z124 Encounter for screening for malignant neoplasm of cervix: Secondary | ICD-10-CM

## 2017-03-07 DIAGNOSIS — N841 Polyp of cervix uteri: Secondary | ICD-10-CM | POA: Diagnosis not present

## 2017-03-07 DIAGNOSIS — Z0000 Encounter for general adult medical examination without abnormal findings: Secondary | ICD-10-CM | POA: Diagnosis not present

## 2017-03-07 DIAGNOSIS — Z8742 Personal history of other diseases of the female genital tract: Secondary | ICD-10-CM

## 2017-03-07 DIAGNOSIS — Z87898 Personal history of other specified conditions: Secondary | ICD-10-CM | POA: Diagnosis not present

## 2017-03-07 NOTE — Patient Instructions (Signed)

## 2017-03-07 NOTE — Progress Notes (Signed)
46 y.o. [id number] Single  Caucasian Fe here for annual exam. Periods normal, OCP working well. Some spotting with sexual activity, but no vaginal symptoms Has been working on weight loss, down 35 pounds, now.Some exercise, not regular. Sees Urgent care if needed. Recent labs for life insurance and had kidney elevations. Request screening labs to recheck. No other health issues today. Enjoying realty work.  Patient's last menstrual period was 02/13/2017.          Sexually active: Yes.    The current method of family planning is OCP (estrogen/progesterone).    Exercising: No.  The patient does not participate in regular exercise at present. Smoker:  no  Health Maintenance: Pap:  12-15-14 neg HPV HR neg, 03-06-16 ASCUS HPV HR - History of Abnormal Pap: no MMG:  7/17 & left breast u/s cyst,category c density birads 2:neg Self Breast exams: no Colonoscopy:  none BMD:   none TDaP:  2009 Shingles: no Pneumonia: no Hep C and HIV: done Labs:    reports that she has never smoked. She has never used smokeless tobacco. She reports that she does not drink alcohol or use drugs.  Past Medical History:  Diagnosis Date  . Abnormal Pap smear     Past Surgical History:  Procedure Laterality Date  . CRYOTHERAPY    . KNEE SURGERY Left     Current Outpatient Prescriptions  Medication Sig Dispense Refill  . TRI-PREVIFEM 0.18/0.215/0.25 MG-35 MCG tablet TAKE 1 TABLET BY MOUTH DAILY. 28 tablet 0   No current facility-administered medications for this visit.     No family history on file.  ROS:  Pertinent items are noted in HPI.  Otherwise, a comprehensive ROS was negative.  Exam:   BP 130/84 (BP Location: Right Arm, Patient Position: Sitting, Cuff Size: Normal)   Pulse 88   Resp 20   Ht 5' 7.24" (1.708 m)   Wt 250 lb 3.2 oz (113.5 kg)   LMP 02/13/2017   BMI 38.91 kg/m  Height: 5' 7.24" (170.8 cm) Ht Readings from Last 3 Encounters:  03/07/17 5' 7.24" (1.708 m)  03/06/16 5' 7.25" (1.708 m)   12/15/14 5' 7.25" (1.708 m)    General appearance: alert, cooperative and appears stated age Head: Normocephalic, without obvious abnormality, atraumatic Neck: no adenopathy, supple, symmetrical, trachea midline and thyroid normal to inspection and palpation Lungs: clear to auscultation bilaterally Breasts: normal appearance, no masses or tenderness, No nipple retraction or dimpling, No nipple discharge or bleeding, No axillary or supraclavicular adenopathy Heart: regular rate and rhythm Abdomen: soft, non-tender; no masses,  no organomegaly Extremities: extremities normal, atraumatic, no cyanosis or edema Skin: Skin color, texture, turgor normal. No rashes or lesions Lymph nodes: Cervical, supraclavicular, and axillary nodes normal. No abnormal inguinal nodes palpated Neurologic: Grossly normal   Pelvic: External genitalia:  no lesions              Urethra:  normal appearing urethra with no masses, tenderness or lesions              Bartholin's and Skene's: normal                 Vagina: normal appearing vagina with normal color and discharge, no lesions              Cervix: no cervical motion tenderness and endocervical polyp  noted approximatedly3 cm, friable, with pedacle base.              Pap taken: Yes.  Bimanual Exam:  Uterus:  normal size, contour, position, consistency, mobility, non-tender              Adnexa: normal adnexa and no mass, fullness, tenderness               Rectovaginal: Confirms               Anus:  normal sphincter tone, no lesions  Chaperone present: yes  A:  Well Woman with normal exam  Contraception OCP desired  Endo cervical polyp(known) has enlarged  Follow up of ASCUS HPVHR negative today  Screening labs  P:   Reviewed health and wellness pertinent to exam  Rx Tri-previfem see order with instructions  Discussed change in size and symptomatic with spotting, would recommend removal due to size. Patient agreeable. Patient will be called with  insurance infor and scheduled. Questions addressed.  Pap smear: yes if normal repeat one year if not per results  counseled on breast self exam, mammography screening, STD prevention, HIV risk factors and prevention, use and side effects of OCP's, adequate intake of calcium and vitamin D, diet and exercise  return annually or prn  An After Visit Summary was printed and given to the patient.

## 2017-03-08 LAB — COMPREHENSIVE METABOLIC PANEL
ALBUMIN: 3.8 g/dL (ref 3.6–5.1)
ALK PHOS: 102 U/L (ref 33–115)
ALT: 12 U/L (ref 6–29)
AST: 12 U/L (ref 10–35)
BILIRUBIN TOTAL: 0.3 mg/dL (ref 0.2–1.2)
BUN: 11 mg/dL (ref 7–25)
CO2: 23 mmol/L (ref 20–31)
Calcium: 9.1 mg/dL (ref 8.6–10.2)
Chloride: 107 mmol/L (ref 98–110)
Creat: 0.79 mg/dL (ref 0.50–1.10)
GLUCOSE: 108 mg/dL — AB (ref 65–99)
POTASSIUM: 4.1 mmol/L (ref 3.5–5.3)
Sodium: 139 mmol/L (ref 135–146)
Total Protein: 6.8 g/dL (ref 6.1–8.1)

## 2017-03-08 LAB — VITAMIN D 25 HYDROXY (VIT D DEFICIENCY, FRACTURES): Vit D, 25-Hydroxy: 11 ng/mL — ABNORMAL LOW (ref 30–100)

## 2017-03-10 ENCOUNTER — Other Ambulatory Visit: Payer: Self-pay

## 2017-03-10 ENCOUNTER — Telehealth: Payer: Self-pay

## 2017-03-10 DIAGNOSIS — Z3041 Encounter for surveillance of contraceptive pills: Secondary | ICD-10-CM

## 2017-03-10 MED ORDER — NORGESTIM-ETH ESTRAD TRIPHASIC 0.18/0.215/0.25 MG-35 MCG PO TABS: 1.0000 | ORAL_TABLET | Freq: Every day | ORAL | 4 refills | Status: DC

## 2017-03-10 MED ORDER — VITAMIN D (ERGOCALCIFEROL) 1.25 MG (50000 UNIT) PO CAPS: 50000.0000 [IU] | ORAL_CAPSULE | ORAL | 0 refills | Status: DC

## 2017-03-10 NOTE — Telephone Encounter (Signed)
Medication refill request: TRI-PREVIFEM Last AEX:  03/07/17 DL Next AEX: 2/9/565/7/19 Last MMG (if hormonal medication request): 7/17 & left breast u/s cyst,category c density birads 2:neg Refill authorized: 02/12/17 #28 w/0 refills; today please advise DL out of office

## 2017-03-10 NOTE — Telephone Encounter (Signed)
Prescription sent to pharmacy on file per patient.

## 2017-03-10 NOTE — Telephone Encounter (Signed)
-----   Message from Verner Choleborah S Leonard, CNM sent at 03/10/2017  7:47 AM EDT ----- Notify patient that her vitamin D is very low, needs Rx and recheck in 3 months please schedule Liver,kidney profile is normal, glucose is elevated she needs repeat fasting glucose please schedule( she can have this done at the same time she comes for polyp removal)

## 2017-03-12 LAB — IPS PAP TEST WITH HPV

## 2017-03-21 ENCOUNTER — Telehealth: Payer: Self-pay

## 2017-03-21 ENCOUNTER — Other Ambulatory Visit: Payer: Self-pay | Admitting: *Deleted

## 2017-03-21 DIAGNOSIS — N841 Polyp of cervix uteri: Secondary | ICD-10-CM

## 2017-03-21 NOTE — Telephone Encounter (Signed)
-----   Message from Verner Choleborah S Leonard, CNM sent at 03/21/2017 11:36 AM EDT ----- Pap smear ASCUS with HPVHR not detected 08 Please notify patient importance of follow up Will need repeat  pap in one year due to previous history of Cryo history 08

## 2017-03-21 NOTE — Telephone Encounter (Signed)
lmtcb

## 2017-03-24 NOTE — Telephone Encounter (Signed)
Return call to Joy. Patient is aware that Ander SladeJoy is out of the office today and will not receive a call until tomorrow.

## 2017-03-25 NOTE — Telephone Encounter (Signed)
Left message for call back.

## 2017-03-25 NOTE — Telephone Encounter (Signed)
Return call to Joy. °

## 2017-03-25 NOTE — Telephone Encounter (Signed)
Patient notified of results. See lab 

## 2017-03-26 ENCOUNTER — Telehealth: Payer: Self-pay | Admitting: Obstetrics & Gynecology

## 2017-03-26 NOTE — Telephone Encounter (Signed)
Call to patient to review benefit for polyp removal and discuss scheduling. Patient answered call and verified name and date of birth. After noting the reason for my call she stated it was not a good time to review information and requested return call tomorrow, 03-27-17, to discuss and schedule.   Patient aware Thomasene LotSuzy will attempt to call her tomorrow.

## 2017-03-27 NOTE — Telephone Encounter (Signed)
Call to patient to review benefits for recommended procedure. Left message on voicemail requesting a return call  Routing to Braxton Feathersebecca Frahm

## 2017-06-04 ENCOUNTER — Other Ambulatory Visit: Payer: Self-pay

## 2018-03-10 ENCOUNTER — Ambulatory Visit: Payer: BLUE CROSS/BLUE SHIELD | Admitting: Certified Nurse Midwife

## 2018-04-20 ENCOUNTER — Telehealth: Payer: Self-pay | Admitting: *Deleted

## 2018-04-20 NOTE — Telephone Encounter (Signed)
Patient is in 08 recall. Please contact patient regarding scheduling AEX/PAP Thanks

## 2018-04-21 NOTE — Telephone Encounter (Signed)
aex is 05-06-18

## 2018-05-06 ENCOUNTER — Ambulatory Visit (INDEPENDENT_AMBULATORY_CARE_PROVIDER_SITE_OTHER): Payer: BLUE CROSS/BLUE SHIELD | Admitting: Certified Nurse Midwife

## 2018-05-06 ENCOUNTER — Other Ambulatory Visit (HOSPITAL_COMMUNITY)
Admission: RE | Admit: 2018-05-06 | Discharge: 2018-05-06 | Disposition: A | Discharge status: Home / Self Care | Payer: BLUE CROSS/BLUE SHIELD | Source: Ambulatory Visit | Attending: Obstetrics & Gynecology | Admitting: Obstetrics & Gynecology

## 2018-05-06 ENCOUNTER — Other Ambulatory Visit: Payer: Self-pay

## 2018-05-06 ENCOUNTER — Encounter: Payer: Self-pay | Admitting: Certified Nurse Midwife

## 2018-05-06 VITALS — BP 124/90 | HR 70 | Resp 16 | Ht 67.5 in | Wt 280.0 lb

## 2018-05-06 DIAGNOSIS — Z3041 Encounter for surveillance of contraceptive pills: Secondary | ICD-10-CM

## 2018-05-06 DIAGNOSIS — E663 Overweight: Secondary | ICD-10-CM

## 2018-05-06 DIAGNOSIS — Z0000 Encounter for general adult medical examination without abnormal findings: Secondary | ICD-10-CM

## 2018-05-06 DIAGNOSIS — Z01411 Encounter for gynecological examination (general) (routine) with abnormal findings: Secondary | ICD-10-CM | POA: Diagnosis not present

## 2018-05-06 DIAGNOSIS — N841 Polyp of cervix uteri: Secondary | ICD-10-CM

## 2018-05-06 DIAGNOSIS — Z1211 Encounter for screening for malignant neoplasm of colon: Secondary | ICD-10-CM

## 2018-05-06 DIAGNOSIS — Z124 Encounter for screening for malignant neoplasm of cervix: Secondary | ICD-10-CM | POA: Insufficient documentation

## 2018-05-06 DIAGNOSIS — R87610 Atypical squamous cells of undetermined significance on cytologic smear of cervix (ASC-US): Secondary | ICD-10-CM

## 2018-05-06 DIAGNOSIS — R6889 Other general symptoms and signs: Secondary | ICD-10-CM

## 2018-05-06 DIAGNOSIS — Z23 Encounter for immunization: Secondary | ICD-10-CM | POA: Diagnosis not present

## 2018-05-06 DIAGNOSIS — E559 Vitamin D deficiency, unspecified: Secondary | ICD-10-CM | POA: Diagnosis not present

## 2018-05-06 MED ORDER — NORGESTIM-ETH ESTRAD TRIPHASIC 0.18/0.215/0.25 MG-35 MCG PO TABS: 1.0000 | ORAL_TABLET | Freq: Every day | ORAL | 0 refills | Status: DC

## 2018-05-06 NOTE — Patient Instructions (Signed)
EXERCISE AND DIET:  We recommended that you start or continue a regular exercise program for good health. Regular exercise means any activity that makes your heart beat faster and makes you sweat.  We recommend exercising at least 30 minutes per day at least 3 days a week, preferably 4 or 5.  We also recommend a diet low in fat and sugar.  Inactivity, poor dietary choices and obesity can cause diabetes, heart attack, stroke, and kidney damage, among others.    ALCOHOL AND SMOKING:  Women should limit their alcohol intake to no more than 7 drinks/beers/glasses of wine (combined, not each!) per week. Moderation of alcohol intake to this level decreases your risk of breast cancer and liver damage. And of course, no recreational drugs are part of a healthy lifestyle.  And absolutely no smoking or even second hand smoke. Most people know smoking can cause heart and lung diseases, but did you know it also contributes to weakening of your bones? Aging of your skin?  Yellowing of your teeth and nails?  CALCIUM AND VITAMIN D:  Adequate intake of calcium and Vitamin D are recommended.  The recommendations for exact amounts of these supplements seem to change often, but generally speaking 600 mg of calcium (either carbonate or citrate) and 800 units of Vitamin D per day seems prudent. Certain women may benefit from higher intake of Vitamin D.  If you are among these women, your doctor will have told you during your visit.    PAP SMEARS:  Pap smears, to check for cervical cancer or precancers,  have traditionally been done yearly, although recent scientific advances have shown that most women can have pap smears less often.  However, every woman still should have a physical exam from her gynecologist every year. It will include a breast check, inspection of the vulva and vagina to check for abnormal growths or skin changes, a visual exam of the cervix, and then an exam to evaluate the size and shape of the uterus and  ovaries.  And after 47 years of age, a rectal exam is indicated to check for rectal cancers. We will also provide age appropriate advice regarding health maintenance, like when you should have certain vaccines, screening for sexually transmitted diseases, bone density testing, colonoscopy, mammograms, etc.   MAMMOGRAMS:  All women over 47 years old should have a yearly mammogram. Many facilities now offer a "3D" mammogram, which may cost around $50 extra out of pocket. If possible,  we recommend you accept the option to have the 3D mammogram performed.  It both reduces the number of women who will be called back for extra views which then turn out to be normal, and it is better than the routine mammogram at detecting truly abnormal areas.    COLONOSCOPY:  Colonoscopy to screen for colon cancer is recommended for all women at age 47.  We know, you hate the idea of the prep.  We agree, BUT, having colon cancer and not knowing it is worse!!  Colon cancer so often starts as a polyp that can be seen and removed at colonscopy, which can quite literally save your life!  And if your first colonoscopy is normal and you have no family history of colon cancer, most women don't have to have it again for 10 years.  Once every ten years, you can do something that may end up saving your life, right?  We will be happy to help you get it scheduled when you are ready.    Be sure to check your insurance coverage so you understand how much it will cost.  It may be covered as a preventative service at no cost, but you should check your particular policy.     endo Calorie Counting for Weight Loss Calories are units of energy. Your body needs a certain amount of calories from food to keep you going throughout the day. When you eat more calories than your body needs, your body stores the extra calories as fat. When you eat fewer calories than your body needs, your body burns fat to get the energy it needs. Calorie counting means  keeping track of how many calories you eat and drink each day. Calorie counting can be helpful if you need to lose weight. If you make sure to eat fewer calories than your body needs, you should lose weight. Ask your health care provider what a healthy weight is for you. For calorie counting to work, you will need to eat the right number of calories in a day in order to lose a healthy amount of weight per week. A dietitian can help you determine how many calories you need in a day and will give you suggestions on how to reach your calorie goal.  A healthy amount of weight to lose per week is usually 1-2 lb (0.5-0.9 kg). This usually means that your daily calorie intake should be reduced by 500-750 calories.  Eating 1,200 - 1,500 calories per day can help most women lose weight.  Eating 1,500 - 1,800 calories per day can help most men lose weight.  What is my plan? My goal is to have __________ calories per day. If I have this many calories per day, I should lose around __________ pounds per week. What do I need to know about calorie counting? In order to meet your daily calorie goal, you will need to:  Find out how many calories are in each food you would like to eat. Try to do this before you eat.  Decide how much of the food you plan to eat.  Write down what you ate and how many calories it had. Doing this is called keeping a food log.  To successfully lose weight, it is important to balance calorie counting with a healthy lifestyle that includes regular activity. Aim for 150 minutes of moderate exercise (such as walking) or 75 minutes of vigorous exercise (such as running) each week. Where do I find calorie information?  The number of calories in a food can be found on a Nutrition Facts label. If a food does not have a Nutrition Facts label, try to look up the calories online or ask your dietitian for help. Remember that calories are listed per serving. If you choose to have more than one  serving of a food, you will have to multiply the calories per serving by the amount of servings you plan to eat. For example, the label on a package of bread might say that a serving size is 1 slice and that there are 90 calories in a serving. If you eat 1 slice, you will have eaten 90 calories. If you eat 2 slices, you will have eaten 180 calories. How do I keep a food log? Immediately after each meal, record the following information in your food log:  What you ate. Don't forget to include toppings, sauces, and other extras on the food.  How much you ate. This can be measured in cups, ounces, or number of items.  How  many calories each food and drink had.  The total number of calories in the meal.  Keep your food log near you, such as in a small notebook in your pocket, or use a mobile app or website. Some programs will calculate calories for you and show you how many calories you have left for the day to meet your goal. What are some calorie counting tips?  Use your calories on foods and drinks that will fill you up and not leave you hungry: ? Some examples of foods that fill you up are nuts and nut butters, vegetables, lean proteins, and high-fiber foods like whole grains. High-fiber foods are foods with more than 5 g fiber per serving. ? Drinks such as sodas, specialty coffee drinks, alcohol, and juices have a lot of calories, yet do not fill you up.  Eat nutritious foods and avoid empty calories. Empty calories are calories you get from foods or beverages that do not have many vitamins or protein, such as candy, sweets, and soda. It is better to have a nutritious high-calorie food (such as an avocado) than a food with few nutrients (such as a bag of chips).  Know how many calories are in the foods you eat most often. This will help you calculate calorie counts faster.  Pay attention to calories in drinks. Low-calorie drinks include water and unsweetened drinks.  Pay attention to  nutrition labels for "low fat" or "fat free" foods. These foods sometimes have the same amount of calories or more calories than the full fat versions. They also often have added sugar, starch, or salt, to make up for flavor that was removed with the fat.  Find a way of tracking calories that works for you. Get creative. Try different apps or programs if writing down calories does not work for you. What are some portion control tips?  Know how many calories are in a serving. This will help you know how many servings of a certain food you can have.  Use a measuring cup to measure serving sizes. You could also try weighing out portions on a kitchen scale. With time, you will be able to estimate serving sizes for some foods.  Take some time to put servings of different foods on your favorite plates, bowls, and cups so you know what a serving looks like.  Try not to eat straight from a bag or box. Doing this can lead to overeating. Put the amount you would like to eat in a cup or on a plate to make sure you are eating the right portion.  Use smaller plates, glasses, and bowls to prevent overeating.  Try not to multitask (for example, watch TV or use your computer) while eating. If it is time to eat, sit down at a table and enjoy your food. This will help you to know when you are full. It will also help you to be aware of what you are eating and how much you are eating. What are tips for following this plan? Reading food labels  Check the calorie count compared to the serving size. The serving size may be smaller than what you are used to eating.  Check the source of the calories. Make sure the food you are eating is high in vitamins and protein and low in saturated and trans fats. Shopping  Read nutrition labels while you shop. This will help you make healthy decisions before you decide to purchase your food.  Make a grocery list and stick   to it. Cooking  Try to cook your favorite foods in a  healthier way. For example, try baking instead of frying.  Use low-fat dairy products. Meal planning  Use more fruits and vegetables. Half of your plate should be fruits and vegetables.  Include lean proteins like poultry and fish. How do I count calories when eating out?  Ask for smaller portion sizes.  Consider sharing an entree and sides instead of getting your own entree.  If you get your own entree, eat only half. Ask for a box at the beginning of your meal and put the rest of your entree in it so you are not tempted to eat it.  If calories are listed on the menu, choose the lower calorie options.  Choose dishes that include vegetables, fruits, whole grains, low-fat dairy products, and lean protein.  Choose items that are boiled, broiled, grilled, or steamed. Stay away from items that are buttered, battered, fried, or served with cream sauce. Items labeled "crispy" are usually fried, unless stated otherwise.  Choose water, low-fat milk, unsweetened iced tea, or other drinks without added sugar. If you want an alcoholic beverage, choose a lower calorie option such as a glass of wine or light beer.  Ask for dressings, sauces, and syrups on the side. These are usually high in calories, so you should limit the amount you eat.  If you want a salad, choose a garden salad and ask for grilled meats. Avoid extra toppings like bacon, cheese, or fried items. Ask for the dressing on the side, or ask for olive oil and vinegar or lemon to use as dressing.  Estimate how many servings of a food you are given. For example, a serving of cooked rice is  cup or about the size of half a baseball. Knowing serving sizes will help you be aware of how much food you are eating at restaurants. The list below tells you how big or small some common portion sizes are based on everyday objects: ? 1 oz-4 stacked dice. ? 3 oz-1 deck of cards. ? 1 tsp-1 die. ? 1 Tbsp- a ping-pong ball. ? 2 Tbsp-1 ping-pong  ball. ?  cup- baseball. ? 1 cup-1 baseball. Summary  Calorie counting means keeping track of how many calories you eat and drink each day. If you eat fewer calories than your body needs, you should lose weight.  A healthy amount of weight to lose per week is usually 1-2 lb (0.5-0.9 kg). This usually means reducing your daily calorie intake by 500-750 calories.  The number of calories in a food can be found on a Nutrition Facts label. If a food does not have a Nutrition Facts label, try to look up the calories online or ask your dietitian for help.  Use your calories on foods and drinks that will fill you up, and not on foods and drinks that will leave you hungry.  Use smaller plates, glasses, and bowls to prevent overeating. This information is not intended to replace advice given to you by your health care provider. Make sure you discuss any questions you have with your health care provider. Document Released: 10/21/2005 Document Revised: 09/20/2016 Document Reviewed: 09/20/2016 [employer]vier Interactive Patient Education  [employer].

## 2018-05-06 NOTE — Progress Notes (Signed)
47 y.o. [id number] Single  Caucasian Fe here for annual exam. Periods normal, no missed periods or missed pills. Patient has had weight gain since fall in the last 6 months of 30 pounds. Had been on weight loss at last visit and had stopped with fall. Still having numbness in right thigh from fall. Planning follow up with Orthopedic soon. Sexually active, no STD concerns or partner change. Sees Urgent care if needed. Screening labs if needed today. No other health issues today.  No LMP recorded.          Sexually active: Yes.    The current method of family planning is OCP (estrogen/progesterone).    Exercising: Yes.    previously exercise and had fall and was treated. Smoker:  no  Health Maintenance: Pap:  03-07-17 ASCUS HPV HR neg History of Abnormal Pap: yes, cryo MMG:  7/17 bilateral & left breast u/s, cyst,category 2:neg Self Breast exams: no Colonoscopy:  none BMD:   none TDaP:  2009  Will do today Shingles: no Pneumonia: no Hep C and HIV: done a long time ago per patient Labs: if needed   reports that she has never smoked. She has never used smokeless tobacco. She reports that she does not drink alcohol or use drugs.  Past Medical History:  Diagnosis Date  . Abnormal Pap smear     Past Surgical History:  Procedure Laterality Date  . CRYOTHERAPY    . KNEE SURGERY Left     Current Outpatient Medications  Medication Sig Dispense Refill  . Norgestimate-Ethinyl Estradiol Triphasic (TRI-PREVIFEM) 0.18/0.215/0.25 MG-35 MCG tablet Take 1 tablet by mouth daily. 3 Package 4  . Vitamin D, Ergocalciferol, (DRISDOL) 50000 units CAPS capsule Take 1 capsule (50,000 Units total) by mouth every 7 (seven) days. 12 capsule 0   No current facility-administered medications for this visit.     No family history on file.  ROS:  Pertinent items are noted in HPI.  Otherwise, a comprehensive ROS was negative.  Exam:   BP 124/90   Pulse 70   Resp 16   Ht 5' 7.5" (1.715 m)   Wt 280 lb (127  kg)   BMI 43.21 kg/m  Height: 5' 7.5" (171.5 cm) Ht Readings from Last 3 Encounters:  05/06/18 5' 7.5" (1.715 m)  03/07/17 5' 7.24" (1.708 m)  03/06/16 5' 7.25" (1.708 m)    General appearance: alert, cooperative and appears stated age Head: Normocephalic, without obvious abnormality, atraumatic Neck: no adenopathy, supple, symmetrical, trachea midline and thyroid normal to inspection and palpation Lungs: clear to auscultation bilaterally Breasts: normal appearance, no masses or tenderness, No nipple retraction or dimpling, No nipple discharge or bleeding, No axillary or supraclavicular adenopathy Heart: regular rate and rhythm Abdomen: soft, non-tender; no masses,  no organomegaly Extremities: extremities normal, atraumatic, no cyanosis or edema Skin: Skin color, texture, turgor normal. No rashes or lesions Lymph nodes: Cervical, supraclavicular, and axillary nodes normal. No abnormal inguinal nodes palpated Neurologic: Grossly normal   Pelvic: External genitalia:  no lesions              Urethra:  normal appearing urethra with no masses, tenderness or lesions              Bartholin's and Skene's: normal                 Vagina: normal appearing vagina with normal color and discharge, no lesions              Cervix:  no cervical motion tenderness, no lesions, nulliparous appearance and large cervical polyp noted in cervical os, bleeding with pap only              Pap taken: Yes.   Bimanual Exam:  Uterus:  normal size, contour, position, consistency, mobility, non-tender and anteverted              Adnexa: normal adnexa and no mass, fullness, tenderness               Rectovaginal: Confirms               Anus:  normal sphincter tone, no lesions  Chaperone present: yes  A:  Well Woman with normal exam  Contraception OCP desired  Follow up pap smear of ASCUS pap negative HPV  Cervical polyp known ( was to have removed last year, patient did follow up)  Weight gain due to fall and  decrease activity  Mammogram due  TDAP due  Colonoscopy due  Screening labs  P:   Reviewed health and wellness pertinent to exam  Discussed risks/benefits/warning signs of OCP use, desires continuance.  Rx Tri Previfem see order with instructions  Discussed removal of polyp and etiology of polyp, normally benign finding, but has enlarged and recommend removal. Patient agreeable and will be called with insurance information and scheduled.  Discussed working on portion control with diet and exercise as tolerated.  Discussed need current mammogram to continue OCP. Patient will schedule.  Desires TDAP  Discussed colonoscopy recommendations declines, IFOB dispensed with instructions  Labs: TSH, Lipid panel, CMP Hgb A1-C, Vit. D  Pap smear: yes   counseled on breast self exam, mammography screening, feminine hygiene, use and side effects of OCP's, adequate intake of calcium and vitamin D, diet and exercise  return annually or prn  An After Visit Summary was printed and given to the patient.

## 2018-05-07 ENCOUNTER — Other Ambulatory Visit: Payer: Self-pay | Admitting: Certified Nurse Midwife

## 2018-05-07 DIAGNOSIS — E559 Vitamin D deficiency, unspecified: Secondary | ICD-10-CM

## 2018-05-07 LAB — VITAMIN D 25 HYDROXY (VIT D DEFICIENCY, FRACTURES): Vit D, 25-Hydroxy: 11.7 ng/mL — ABNORMAL LOW (ref 30.0–100.0)

## 2018-05-07 LAB — COMPREHENSIVE METABOLIC PANEL
ALBUMIN: 4 g/dL (ref 3.5–5.5)
ALT: 10 IU/L (ref 0–32)
AST: 9 IU/L (ref 0–40)
Albumin/Globulin Ratio: 1.4 (ref 1.2–2.2)
Alkaline Phosphatase: 105 IU/L (ref 39–117)
BILIRUBIN TOTAL: 0.3 mg/dL (ref 0.0–1.2)
BUN / CREAT RATIO: 12 (ref 9–23)
BUN: 8 mg/dL (ref 6–24)
CALCIUM: 8.9 mg/dL (ref 8.7–10.2)
CHLORIDE: 106 mmol/L (ref 96–106)
CO2: 21 mmol/L (ref 20–29)
Creatinine, Ser: 0.69 mg/dL (ref 0.57–1.00)
GFR calc Af Amer: 120 mL/min/{1.73_m2} (ref >59)
GFR, EST NON AFRICAN AMERICAN: 104 mL/min/{1.73_m2} (ref >59)
GLUCOSE: 99 mg/dL (ref 65–99)
Globulin, Total: 2.8 g/dL (ref 1.5–4.5)
Potassium: 4.1 mmol/L (ref 3.5–5.2)
Sodium: 141 mmol/L (ref 134–144)
TOTAL PROTEIN: 6.8 g/dL (ref 6.0–8.5)

## 2018-05-07 LAB — LIPID PANEL
CHOL/HDL RATIO: 2.1 ratio (ref 0.0–4.4)
Cholesterol, Total: 163 mg/dL (ref 100–199)
HDL: 76 mg/dL (ref >39)
LDL CALC: 68 mg/dL (ref 0–99)
Triglycerides: 96 mg/dL (ref 0–149)
VLDL CHOLESTEROL CAL: 19 mg/dL (ref 5–40)

## 2018-05-07 LAB — HEMOGLOBIN A1C
ESTIMATED AVERAGE GLUCOSE: 111 mg/dL
HEMOGLOBIN A1C: 5.5 % (ref 4.8–5.6)

## 2018-05-07 LAB — TSH: TSH: 2.52 u[IU]/mL (ref 0.450–4.500)

## 2018-05-08 ENCOUNTER — Other Ambulatory Visit: Payer: Self-pay

## 2018-05-08 DIAGNOSIS — E559 Vitamin D deficiency, unspecified: Secondary | ICD-10-CM

## 2018-05-08 DIAGNOSIS — N841 Polyp of cervix uteri: Secondary | ICD-10-CM

## 2018-05-08 LAB — CYTOLOGY - PAP
DIAGNOSIS: NEGATIVE
HPV (WINDOPATH): NOT DETECTED

## 2018-05-08 MED ORDER — VITAMIN D (ERGOCALCIFEROL) 1.25 MG (50000 UNIT) PO CAPS: 50000.0000 [IU] | ORAL_CAPSULE | ORAL | 0 refills | Status: DC

## 2018-05-12 ENCOUNTER — Telehealth: Payer: Self-pay | Admitting: Certified Nurse Midwife

## 2018-05-12 NOTE — Telephone Encounter (Signed)
Call placed to convey benefits for cervical polyp removal.

## 2018-05-14 ENCOUNTER — Telehealth: Payer: Self-pay | Admitting: Certified Nurse Midwife

## 2018-05-14 NOTE — Telephone Encounter (Signed)
Call placed in reference to a procedure appointment.

## 2018-07-23 ENCOUNTER — Other Ambulatory Visit: Payer: Self-pay | Admitting: Certified Nurse Midwife

## 2018-07-23 DIAGNOSIS — Z3041 Encounter for surveillance of contraceptive pills: Secondary | ICD-10-CM

## 2018-07-23 NOTE — Telephone Encounter (Signed)
Medication refill request: TRI Previem Last AEX:  05/06/2018 Next AEX: 05/2019 Last MMG (if hormonal medication request): n/a Refill authorized: #[id number], 3 refills

## 2018-07-23 NOTE — Telephone Encounter (Signed)
Patient was to schedule mammogram it has been two years and need to be current to stay on OCP. Will refill x 1 only until in.   We discussed at visit

## 2018-08-02 ENCOUNTER — Other Ambulatory Visit: Payer: Self-pay | Admitting: Certified Nurse Midwife

## 2018-08-02 DIAGNOSIS — E559 Vitamin D deficiency, unspecified: Secondary | ICD-10-CM

## 2018-08-12 ENCOUNTER — Other Ambulatory Visit: Payer: BLUE CROSS/BLUE SHIELD

## 2018-08-12 ENCOUNTER — Telehealth: Payer: Self-pay | Admitting: Certified Nurse Midwife

## 2018-08-12 NOTE — Telephone Encounter (Signed)
Call placed in reference to a reccommended procedure.

## 2018-08-17 ENCOUNTER — Other Ambulatory Visit: Payer: BLUE CROSS/BLUE SHIELD

## 2018-08-18 ENCOUNTER — Telehealth: Payer: Self-pay | Admitting: Certified Nurse Midwife

## 2018-08-18 ENCOUNTER — Other Ambulatory Visit (INDEPENDENT_AMBULATORY_CARE_PROVIDER_SITE_OTHER): Payer: BLUE CROSS/BLUE SHIELD

## 2018-08-18 DIAGNOSIS — E559 Vitamin D deficiency, unspecified: Secondary | ICD-10-CM

## 2018-08-18 NOTE — Telephone Encounter (Signed)
Patient went to pick up her birth control and was told this was her last pack. Patient was last seen 05/06/2018 for her aex and does not understand why she would not have refills for a year?

## 2018-08-18 NOTE — Telephone Encounter (Signed)
Spoke with patient. Advised patient she is over due for her mammogram. 3 month supply of OCP was given to give her time to have this done. This will need to be completed before further refills are given. Patient verbalizes understanding.  Routing to provider and will close encounter.

## 2018-08-19 ENCOUNTER — Other Ambulatory Visit: Payer: Self-pay | Admitting: Certified Nurse Midwife

## 2018-08-19 DIAGNOSIS — E559 Vitamin D deficiency, unspecified: Secondary | ICD-10-CM

## 2018-08-19 LAB — VITAMIN D 25 HYDROXY (VIT D DEFICIENCY, FRACTURES): VIT D 25 HYDROXY: 22 ng/mL — AB (ref 30.0–100.0)

## 2018-08-20 ENCOUNTER — Other Ambulatory Visit: Payer: Self-pay

## 2018-08-20 MED ORDER — VITAMIN D (ERGOCALCIFEROL) 1.25 MG (50000 UNIT) PO CAPS: 50000.0000 [IU] | ORAL_CAPSULE | ORAL | 0 refills | Status: DC

## 2018-08-27 NOTE — Telephone Encounter (Signed)
Call placed in reference to a recommended procedure. °

## 2018-08-27 NOTE — Telephone Encounter (Signed)
Call placed to follow up and schedule recommended procedure.

## 2018-09-10 ENCOUNTER — Telehealth: Payer: Self-pay | Admitting: Certified Nurse Midwife

## 2018-09-10 NOTE — Telephone Encounter (Signed)
Call placed to schedule recommended procedure.

## 2018-10-08 ENCOUNTER — Other Ambulatory Visit: Payer: Self-pay | Admitting: Certified Nurse Midwife

## 2018-10-08 DIAGNOSIS — Z1231 Encounter for screening mammogram for malignant neoplasm of breast: Secondary | ICD-10-CM

## 2018-10-23 ENCOUNTER — Other Ambulatory Visit: Payer: Self-pay | Admitting: Certified Nurse Midwife

## 2018-10-23 DIAGNOSIS — Z3041 Encounter for surveillance of contraceptive pills: Secondary | ICD-10-CM

## 2018-10-23 NOTE — Telephone Encounter (Signed)
Patient stated that she could not get her prescription refilled due to not having a mammogram. Patient has scheduled a mammogram for 11/20/18. Patient is calling to initiate refill again for Tri-Previfem.

## 2018-10-23 NOTE — Telephone Encounter (Signed)
Medication refill request: tr iprevifem Last AEX:  05-06-18 Next AEX: 05-13-19 Last MMG (if hormonal medication request): scheduled for 1/20 Refill authorized: patient called. See message below. Please approve if appropriate

## 2018-10-26 ENCOUNTER — Other Ambulatory Visit: Payer: Self-pay | Admitting: Certified Nurse Midwife

## 2018-10-26 DIAGNOSIS — Z3041 Encounter for surveillance of contraceptive pills: Secondary | ICD-10-CM

## 2018-10-26 MED ORDER — NORGESTIM-ETH ESTRAD TRIPHASIC 0.18/0.215/0.25 MG-35 MCG PO TABS: 1.0000 | ORAL_TABLET | Freq: Every day | ORAL | 0 refills | Status: DC

## 2018-10-26 NOTE — Telephone Encounter (Signed)
Routed to Dr. Edward JollySilva in Debbie's absence

## 2018-10-26 NOTE — Telephone Encounter (Signed)
Patient is calling for the status of her refill request. She states that she is out of her prescription and really needs this today.

## 2018-11-15 ENCOUNTER — Other Ambulatory Visit: Payer: Self-pay | Admitting: Obstetrics and Gynecology

## 2018-11-15 ENCOUNTER — Other Ambulatory Visit: Payer: Self-pay | Admitting: Certified Nurse Midwife

## 2018-11-15 DIAGNOSIS — Z3041 Encounter for surveillance of contraceptive pills: Secondary | ICD-10-CM

## 2018-11-16 NOTE — Telephone Encounter (Signed)
Medication refill request: Vitamin D 3  Last AEX: 05/06/18  Next AEX: 05/13/19 Last MMG (if hormonal medication request): 05/31/16 Bi-rads 2 benign  Refill authorized:  #[id number] with 1 RF please advise

## 2018-11-17 NOTE — Telephone Encounter (Signed)
Called patient and left message to call Katherine Murphy back to schedule a lab visit for vitamin D check.

## 2018-11-17 NOTE — Telephone Encounter (Signed)
Patient was to come in and have recheck in 3 months from her 10/19 results. She needs lab appt. She may not need Rx and can do OTC once checked

## 2018-11-18 ENCOUNTER — Other Ambulatory Visit: Payer: Self-pay | Admitting: Certified Nurse Midwife

## 2018-11-18 DIAGNOSIS — Z3041 Encounter for surveillance of contraceptive pills: Secondary | ICD-10-CM

## 2018-11-18 NOTE — Telephone Encounter (Signed)
Patient called to report she is having a mammogram at [facility] this Friday, 11/20/18, but she will need a refill on her birth control this weekend. She wants to make sure she gets her refill in time.  Pharmacy on file confirmed.

## 2018-11-18 NOTE — Telephone Encounter (Signed)
Patient returned call and scheduled a test for that on 11/30/18.

## 2018-11-19 NOTE — Telephone Encounter (Signed)
Medication refill request:Tri- Previfem Last AEX:  05/06/18 Next AEX: 05/13/19 Last MMG (if hormonal medication request): 05/31/16 Bi-rads 2 Benign Mamm scheduled for 11/20/18. Patient states that she will be out of OCP by the weekend Refill authorized: #[id number] with 0 RF

## 2018-11-19 NOTE — Telephone Encounter (Signed)
Will refill once mammogram seen if does tomorrow per note

## 2018-11-20 ENCOUNTER — Ambulatory Visit
Admission: RE | Admit: 2018-11-20 | Discharge: 2018-11-20 | Disposition: A | Discharge status: Home / Self Care | Payer: BLUE CROSS/BLUE SHIELD | Source: Ambulatory Visit | Attending: Certified Nurse Midwife | Admitting: Certified Nurse Midwife

## 2018-11-20 DIAGNOSIS — Z1231 Encounter for screening mammogram for malignant neoplasm of breast: Secondary | ICD-10-CM

## 2018-11-20 MED ORDER — NORGESTIM-ETH ESTRAD TRIPHASIC 0.18/0.215/0.25 MG-35 MCG PO TABS: 1.0000 | ORAL_TABLET | Freq: Every day | ORAL | 6 refills | Status: DC

## 2018-11-20 NOTE — Telephone Encounter (Signed)
Patient is calling stating that the wrong prescription was called to her pharmacy. She said the vitamin d was called in instead of her birth control.

## 2018-11-20 NOTE — Telephone Encounter (Signed)
Called patient to let her know we are waiting on Mammogram to refill OCP. She stated that she had it done this morning.  Report not in epic as of 1136.

## 2018-11-30 ENCOUNTER — Other Ambulatory Visit (INDEPENDENT_AMBULATORY_CARE_PROVIDER_SITE_OTHER): Payer: BLUE CROSS/BLUE SHIELD

## 2018-11-30 DIAGNOSIS — E559 Vitamin D deficiency, unspecified: Secondary | ICD-10-CM

## 2018-12-01 LAB — VITAMIN D 25 HYDROXY (VIT D DEFICIENCY, FRACTURES): Vit D, 25-Hydroxy: 19.3 ng/mL — ABNORMAL LOW (ref 30.0–100.0)

## 2018-12-02 ENCOUNTER — Other Ambulatory Visit: Payer: Self-pay

## 2018-12-02 DIAGNOSIS — E559 Vitamin D deficiency, unspecified: Secondary | ICD-10-CM

## 2018-12-02 MED ORDER — VITAMIN D (ERGOCALCIFEROL) 1.25 MG (50000 UNIT) PO CAPS: 50000.0000 [IU] | ORAL_CAPSULE | ORAL | 0 refills | Status: DC

## 2019-02-24 ENCOUNTER — Other Ambulatory Visit: Payer: BLUE CROSS/BLUE SHIELD

## 2019-04-13 ENCOUNTER — Telehealth: Payer: Self-pay | Admitting: Certified Nurse Midwife

## 2019-04-13 NOTE — Telephone Encounter (Signed)
Left message on voicemail to call and reschedule cancelled appointment. °

## 2019-05-13 ENCOUNTER — Ambulatory Visit: Payer: BLUE CROSS/BLUE SHIELD | Admitting: Certified Nurse Midwife

## 2019-05-31 ENCOUNTER — Other Ambulatory Visit: Payer: Self-pay | Admitting: Certified Nurse Midwife

## 2019-05-31 DIAGNOSIS — Z3041 Encounter for surveillance of contraceptive pills: Secondary | ICD-10-CM

## 2019-05-31 NOTE — Telephone Encounter (Signed)
Medication refill request: OCP Last AEX:  05/06/18 DL Next AEX: none Last MMG (if hormonal medication request): 11/20/18 BIRADS1:neg  Refill authorized: 11/20/18 #[id number]

## 2019-05-31 NOTE — Telephone Encounter (Signed)
Left voicemail for patient to call back to schedule AEX.

## 2019-06-27 ENCOUNTER — Other Ambulatory Visit: Payer: Self-pay | Admitting: Certified Nurse Midwife

## 2019-06-27 DIAGNOSIS — Z3041 Encounter for surveillance of contraceptive pills: Secondary | ICD-10-CM

## 2019-06-28 NOTE — Telephone Encounter (Signed)
Medication refill request: Tri-Previfem Last AEX:  05/06/2018 Next AEX: None. Will call to schedule. Last MMG (if hormonal medication request): 11/20/2018 Negative Refill authorized: Pending authorization. #[id number] with no refills. Please advise.

## 2019-07-25 ENCOUNTER — Other Ambulatory Visit: Payer: Self-pay | Admitting: Certified Nurse Midwife

## 2019-07-25 DIAGNOSIS — Z3041 Encounter for surveillance of contraceptive pills: Secondary | ICD-10-CM

## 2019-07-28 ENCOUNTER — Other Ambulatory Visit: Payer: Self-pay | Admitting: Certified Nurse Midwife

## 2019-07-28 DIAGNOSIS — Z3041 Encounter for surveillance of contraceptive pills: Secondary | ICD-10-CM

## 2019-07-28 MED ORDER — NORGESTIM-ETH ESTRAD TRIPHASIC 0.18/0.215/0.25 MG-35 MCG PO TABS: 1.0000 | ORAL_TABLET | Freq: Every day | ORAL | 0 refills | Status: DC

## 2019-07-28 NOTE — Telephone Encounter (Signed)
Medication refill request: Tri-oreviefem  Last AEX:  05/06/18 Next AEX: 08/16/19 Last MMG (if hormonal medication request): 11/20/18  Bi-rads 1 neg  Refill authorized: #[id number] 0 RF

## 2019-07-28 NOTE — Telephone Encounter (Signed)
Patient requesting refill on birth control. aex scheduled for 10/12. [facility] [phone]

## 2019-08-12 ENCOUNTER — Other Ambulatory Visit: Payer: Self-pay

## 2019-08-16 ENCOUNTER — Telehealth: Payer: Self-pay | Admitting: *Deleted

## 2019-08-16 ENCOUNTER — Ambulatory Visit: Payer: BC Managed Care – PPO | Admitting: Certified Nurse Midwife

## 2019-08-16 ENCOUNTER — Other Ambulatory Visit: Payer: Self-pay

## 2019-08-16 ENCOUNTER — Encounter: Payer: Self-pay | Admitting: Certified Nurse Midwife

## 2019-08-16 VITALS — BP 120/84 | HR 70 | Temp 97.0°F | Resp 16 | Ht 67.5 in | Wt 285.0 lb

## 2019-08-16 DIAGNOSIS — Z3041 Encounter for surveillance of contraceptive pills: Secondary | ICD-10-CM | POA: Diagnosis not present

## 2019-08-16 DIAGNOSIS — R0989 Other specified symptoms and signs involving the circulatory and respiratory systems: Secondary | ICD-10-CM

## 2019-08-16 DIAGNOSIS — Z8709 Personal history of other diseases of the respiratory system: Secondary | ICD-10-CM

## 2019-08-16 DIAGNOSIS — Z01419 Encounter for gynecological examination (general) (routine) without abnormal findings: Secondary | ICD-10-CM | POA: Diagnosis not present

## 2019-08-16 DIAGNOSIS — Z87898 Personal history of other specified conditions: Secondary | ICD-10-CM

## 2019-08-16 MED ORDER — NORGESTIM-ETH ESTRAD TRIPHASIC 0.18/0.215/0.25 MG-35 MCG PO TABS: 1.0000 | ORAL_TABLET | Freq: Every day | ORAL | 3 refills | Status: DC

## 2019-08-16 NOTE — Telephone Encounter (Signed)
Order placed for GI, Dr. Collene Mares.   Routing to [employer] .

## 2019-08-16 NOTE — Patient Instructions (Signed)
EXERCISE AND DIET:  We recommended that you start or continue a regular exercise program for good health. Regular exercise means any activity that makes your heart beat faster and makes you sweat.  We recommend exercising at least 30 minutes per day at least 3 days a week, preferably 4 or 5.  We also recommend a diet low in fat and sugar.  Inactivity, poor dietary choices and obesity can cause diabetes, heart attack, stroke, and kidney damage, among others.   ° °ALCOHOL AND SMOKING:  Women should limit their alcohol intake to no more than 7 drinks/beers/glasses of wine (combined, not each!) per week. Moderation of alcohol intake to this level decreases your risk of breast cancer and liver damage. And of course, no recreational drugs are part of a healthy lifestyle.  And absolutely no smoking or even second hand smoke. Most people know smoking can cause heart and lung diseases, but did you know it also contributes to weakening of your bones? Aging of your skin?  Yellowing of your teeth and nails? ° °CALCIUM AND VITAMIN D:  Adequate intake of calcium and Vitamin D are recommended.  The recommendations for exact amounts of these supplements seem to change often, but generally speaking 600 mg of calcium (either carbonate or citrate) and 800 units of Vitamin D per day seems prudent. Certain women may benefit from higher intake of Vitamin D.  If you are among these women, your doctor will have told you during your visit.   ° °PAP SMEARS:  Pap smears, to check for cervical cancer or precancers,  have traditionally been done yearly, although recent scientific advances have shown that most women can have pap smears less often.  However, every woman still should have a physical exam from her gynecologist every year. It will include a breast check, inspection of the vulva and vagina to check for abnormal growths or skin changes, a visual exam of the cervix, and then an exam to evaluate the size and shape of the uterus and  ovaries.  And after 48 years of age, a rectal exam is indicated to check for rectal cancers. We will also provide age appropriate advice regarding health maintenance, like when you should have certain vaccines, screening for sexually transmitted diseases, bone density testing, colonoscopy, mammograms, etc.  ° °MAMMOGRAMS:  All women over 40 years old should have a yearly mammogram. Many facilities now offer a "3D" mammogram, which may cost around $50 extra out of pocket. If possible,  we recommend you accept the option to have the 3D mammogram performed.  It both reduces the number of women who will be called back for extra views which then turn out to be normal, and it is better than the routine mammogram at detecting truly abnormal areas.   ° °COLONOSCOPY:  Colonoscopy to screen for colon cancer is recommended for all women at age 50.  We know, you hate the idea of the prep.  We agree, BUT, having colon cancer and not knowing it is worse!!  Colon cancer so often starts as a polyp that can be seen and removed at colonscopy, which can quite literally save your life!  And if your first colonoscopy is normal and you have no family history of colon cancer, most women don't have to have it again for 10 years.  Once every ten years, you can do something that may end up saving your life, right?  We will be happy to help you get it scheduled when you are ready.    Be sure to check your insurance coverage so you understand how much it will cost.  It may be covered as a preventative service at no cost, but you should check your particular policy.      Cough, Adult A cough helps to clear your throat and lungs. A cough may be a sign of an illness or another medical condition. An acute cough may only last 2-3 weeks, while a chronic cough may last 8 or more weeks. Many things can cause a cough. They include:  Germs (viruses or bacteria) that attack the airway.  Breathing in things that bother (irritate) your  lungs.  Allergies.  Asthma.  Mucus that runs down the back of your throat (postnasal drip).  Smoking.  Acid backing up from the stomach into the tube that moves food from the mouth to the stomach (gastroesophageal reflux).  Some medicines.  Lung problems.  Other medical conditions, such as heart failure or a blood clot in the lung (pulmonary embolism). Follow these instructions at home: Medicines  Take over-the-counter and prescription medicines only as told by your doctor.  Talk with your doctor before you take medicines that stop a cough (coughsuppressants). Lifestyle   Do not smoke, and try not to be around smoke. Do not use any products that contain nicotine or tobacco, such as cigarettes, e-cigarettes, and chewing tobacco. If you need help quitting, ask your doctor.  Drink enough fluid to keep your pee (urine) pale yellow.  Avoid caffeine.  Do not drink alcohol if your doctor tells you not to drink. General instructions   Watch for any changes in your cough. Tell your doctor about them.  Always cover your mouth when you cough.  Stay away from things that make you cough, such as perfume, candles, campfire smoke, or cleaning products.  If the air is dry, use a cool mist vaporizer or humidifier in your home.  If your cough is worse at night, try using extra pillows to raise your head up higher while you sleep.  Rest as needed.  Keep all follow-up visits as told by your doctor. This is important. Contact a doctor if:  You have new symptoms.  You cough up pus.  Your cough does not get better after 2-3 weeks, or your cough gets worse.  Cough medicine does not help your cough and you are not sleeping well.  You have pain that gets worse or pain that is not helped with medicine.  You have a fever.  You are losing weight and you do not know why.  You have night sweats. Get help right away if:  You cough up blood.  You have trouble breathing.  Your  heartbeat is very fast. These symptoms may be an emergency. Do not wait to see if the symptoms will go away. Get medical help right away. Call your local emergency services (911 in the [place] Do not drive yourself to the hospital. Summary  A cough helps to clear your throat and lungs. Many things can cause a cough.  Take over-the-counter and prescription medicines only as told by your doctor.  Always cover your mouth when you cough.  Contact a doctor if you have new symptoms or you have a cough that does not get better or gets worse. This information is not intended to replace advice given to you by your health care provider. Make sure you discuss any questions you have with your health care provider. Document Released: 07/04/2011 Document Revised: 11/09/2018 Document Reviewed: 11/09/2018 Elsevier  Patient Education  [employer].  Oral Contraception Use Oral contraceptive pills (OCPs) are medicines that you take to prevent pregnancy. OCPs work by:  Preventing the ovaries from releasing eggs.  Thickening mucus in the lower part of the uterus (cervix), which prevents sperm from entering the uterus.  Thinning the lining of the uterus (endometrium), which prevents a fertilized egg from attaching to the endometrium. OCPs are highly effective when taken exactly as prescribed. However, OCPs do not prevent sexually transmitted infections (STIs). Safe sex practices, such as using condoms while on an OCP, can help prevent STIs. Before taking OCPs, you may have a physical exam, blood test, and Pap test. A Pap test involves taking a sample of cells from your cervix to check for cancer. Discuss with your health care provider the possible side effects of the OCP you may be prescribed. When you start an OCP, be aware that it can take 2-3 months for your body to adjust to changes in hormone levels. How to take oral contraceptive pills Follow instructions from your health care provider about how to  start taking your first cycle of OCPs. Your health care provider may recommend that you:  Start the pill on day 1 of your menstrual period. If you start at this time, you will not need any backup form of birth control (contraception), such as condoms.  Start the pill on the first Sunday after your menstrual period or on the day you get your prescription. In these cases, you will need to use backup contraception for the first week.  Start the pill at any time of your cycle. ? If you take the pill within 5 days of the start of your period, you will not need a backup form of contraception. ? If you start at any other time of your menstrual cycle, you will need to use another form of contraception for 7 days. If your OCP is the type called a minipill, it will protect you from pregnancy after taking it for 2 days (48 hours), and you can stop using backup contraception after that time. After you have started taking OCPs:  If you forget to take 1 pill, take it as soon as you remember. Take the next pill at the regular time.  If you miss 2 or more pills, call your health care provider. Different pills have different instructions for missed doses. Use backup birth control until your next menstrual period starts.  If you use a 28-day pack that contains inactive pills and you miss 1 of the last 7 pills (pills with no hormones), throw away the rest of the non-hormone pills and start a new pill pack. No matter which day you start the OCP, you will always start a new pack on that same day of the week. Have an extra pack of OCPs and a backup contraceptive method available in case you miss some pills or lose your OCP pack. Follow these instructions at home:  Do not use any products that contain nicotine or tobacco, such as cigarettes and e-cigarettes. If you need help quitting, ask your health care provider.  Always use a condom to protect against STIs. OCPs do not protect against STIs.  Use a calendar to  mark the days of your menstrual period.  Read the information and directions that came with your OCP. Talk to your health care provider if you have questions. Contact a health care provider if:  You develop nausea and vomiting.  You have abnormal vaginal discharge  or bleeding.  You develop a rash.  You miss your menstrual period. Depending on the type of OCP you are taking, this may be a sign of pregnancy. Ask your health care provider for more information.  You are losing your hair.  You need treatment for mood swings or depression.  You get dizzy when taking the OCP.  You develop acne after taking the OCP.  You become pregnant or think you may be pregnant.  You have diarrhea, constipation, and abdominal pain or cramps.  You miss 2 or more pills. Get help right away if:  You develop chest pain.  You develop shortness of breath.  You have an uncontrolled or severe headache.  You develop numbness or slurred speech.  You develop visual or speech problems.  You develop pain, redness, and swelling in your legs.  You develop weakness or numbness in your arms or legs. Summary  Oral contraceptive pills (OCPs) are medicines that you take to prevent pregnancy.  OCPs do not prevent sexually transmitted infections (STIs). Always use a condom to protect against STIs.  When you start an OCP, be aware that it can take 2-3 months for your body to adjust to changes in hormone levels.  Read all the information and directions that come with your OCP. This information is not intended to replace advice given to you by your health care provider. Make sure you discuss any questions you have with your health care provider. Document Released: 10/10/2011 Document Revised: 02/12/2019 Document Reviewed: 12/02/2016 Elsevier Patient Education  2020 [employer].

## 2019-08-16 NOTE — Telephone Encounter (Signed)
-----   Message from Regina Eck, CNM sent at 08/16/2019 12:44 PM EDT ----- Patient need referral to Dr. Collene Mares for possible GERD and choking sensation with persistent cough over the past several years. Has seen allergist and PCP. Has increased. She is aware she will be called with information.

## 2019-08-16 NOTE — Progress Notes (Signed)
48 y.o. [id number] Single  Caucasian Fe here for annual exam.  Periods normal, no missed periods over last year. Contraception working well, no missed pills or periods. Continue to persist cough after eating and sometimes will vomit. She has been to allergist, ENT and PCP, given steroid and even Codeine for cough but comes back. She feels she has difficulty swallowing food now. "Help". No partner change, no STD screening needed. No other health issues today.  No LMP recorded.          Sexually active: Yes.    The current method of family planning is OCP (estrogen/progesterone).    Exercising: No.  exercise Smoker:  no  Review of Systems  Constitutional: Negative.   HENT: Negative.   Eyes: Negative.   Respiratory: Negative.   Cardiovascular: Negative.   Gastrointestinal: Negative.   Genitourinary: Negative.   Musculoskeletal: Negative.   Skin: Negative.   Neurological: Negative.   Endo/Heme/Allergies: Negative.   Psychiatric/Behavioral: Negative.     Health Maintenance: Pap:  03-07-17 ASCUS HPV HR neg, 05-06-18 neg HPV HR neg History of Abnormal Pap: yes,cryo MMG:  11-20-2018 category c density birads 1:neg Self Breast exams: no Colonoscopy:  none BMD:   none TDaP:  2019 Shingles: no Pneumonia: no Hep C and HIV: neg per patient yrs ago Labs: declines   reports that she has never smoked. She has never used smokeless tobacco. She reports that she does not drink alcohol or use drugs.  Past Medical History:  Diagnosis Date  . Abnormal Pap smear     Past Surgical History:  Procedure Laterality Date  . CRYOTHERAPY    . KNEE SURGERY Left     Current Outpatient Medications  Medication Sig Dispense Refill  . Norgestimate-Ethinyl Estradiol Triphasic (TRI-PREVIFEM) 0.18/0.215/0.25 MG-35 MCG tablet Take 1 tablet by mouth daily. 28 tablet 0  . predniSONE (DELTASONE) 20 MG tablet TAKE 3 (THREE) TABLET DAILY    . Vitamin D, Ergocalciferol, (DRISDOL) 1.25 MG (50000 UT) CAPS capsule Take 1  capsule (50,000 Units total) by mouth every 7 (seven) days. 12 capsule 0  . Vitamin D, Ergocalciferol, (DRISDOL) 50000 units CAPS capsule Take 1 capsule (50,000 Units total) by mouth every 7 (seven) days. 12 capsule 0  . Vitamin D, Ergocalciferol, (DRISDOL) 50000 units CAPS capsule Take 1 capsule (50,000 Units total) by mouth every 7 (seven) days. 12 capsule 0   No current facility-administered medications for this visit.     No family history on file.  ROS:  Pertinent items are noted in HPI.  Otherwise, a comprehensive ROS was negative.  Exam:   There were no vitals taken for this visit.   Ht Readings from Last 3 Encounters:  05/06/18 5' 7.5" (1.715 m)  03/07/17 5' 7.24" (1.708 m)  03/06/16 5' 7.25" (1.708 m)    General appearance: alert, cooperative and appears stated age Head: Normocephalic, without obvious abnormality, atraumatic Neck: no adenopathy, supple, symmetrical, trachea midline and thyroid normal to inspection and palpation and no masses palpated Lungs: clear to auscultation bilaterally Breasts: normal appearance, no masses or tenderness, No nipple retraction or dimpling, No nipple discharge or bleeding, No axillary or supraclavicular adenopathy Heart: regular rate and rhythm Abdomen: soft, non-tender; no masses,  no organomegaly Extremities: extremities normal, atraumatic, no cyanosis or edema Skin: Skin color, texture, turgor normal. No rashes or lesions Lymph nodes: Cervical, supraclavicular, and axillary nodes normal. No abnormal inguinal nodes palpated Neurologic: Grossly normal   Pelvic: External genitalia:  no lesions  Urethra:  normal appearing urethra with no masses, tenderness or lesions              Bartholin's and Skene's: normal                 Vagina: normal appearing vagina with normal color and discharge, no lesions              Cervix: no cervical motion tenderness, no lesions and normal appearance              Pap taken: No. Bimanual  Exam:  Uterus:  normal size, contour, position, consistency, mobility, non-tender              Adnexa: normal adnexa and no mass, fullness, tenderness               Rectovaginal: Confirms               Anus:  normal sphincter tone, no lesions  Chaperone present: yes  A:  Well Woman with normal exam  Contraception OCP desired  Persistent cough and some choking with food intake  Overweight working on diet again  P:   Reviewed health and wellness pertinent to exam  Risks/benefits/warning signs of OCP given. Needs to advise if occurs.  Rx Tri previfem see order with instructions  Discussed cough and possible GI related due to choking sensation. Recommend due to the problem changing recommend referral to GI for evaluation for esophageal issue. Patient thankful referral. She will be called with information of appointment.  Stressed health benefits of losing weight and encouraged to keep working on.  Pap smear: no   counseled on breast self exam, mammography screening, STD prevention, HIV risk factors and prevention, feminine hygiene, use and side effects of OCP's, adequate intake of calcium and vitamin D, diet and exercise  return annually or prn  An After Visit Summary was printed and given to the patient.

## 2019-08-31 DIAGNOSIS — R1310 Dysphagia, unspecified: Secondary | ICD-10-CM | POA: Diagnosis not present

## 2019-08-31 DIAGNOSIS — K219 Gastro-esophageal reflux disease without esophagitis: Secondary | ICD-10-CM | POA: Diagnosis not present

## 2019-08-31 DIAGNOSIS — Z1211 Encounter for screening for malignant neoplasm of colon: Secondary | ICD-10-CM | POA: Diagnosis not present

## 2019-08-31 DIAGNOSIS — R05 Cough: Secondary | ICD-10-CM | POA: Diagnosis not present

## 2019-09-01 ENCOUNTER — Other Ambulatory Visit: Payer: Self-pay | Admitting: Gastroenterology

## 2019-09-01 DIAGNOSIS — R1310 Dysphagia, unspecified: Secondary | ICD-10-CM

## 2019-09-03 ENCOUNTER — Ambulatory Visit
Admission: RE | Admit: 2019-09-03 | Discharge: 2019-09-03 | Disposition: A | Discharge status: Home / Self Care | Payer: BC Managed Care – PPO | Source: Ambulatory Visit | Attending: Gastroenterology | Admitting: Gastroenterology

## 2019-09-03 DIAGNOSIS — R1310 Dysphagia, unspecified: Secondary | ICD-10-CM

## 2019-09-03 DIAGNOSIS — K222 Esophageal obstruction: Secondary | ICD-10-CM | POA: Diagnosis not present

## 2019-09-07 DIAGNOSIS — R748 Abnormal levels of other serum enzymes: Secondary | ICD-10-CM | POA: Diagnosis not present

## 2019-09-08 ENCOUNTER — Other Ambulatory Visit (HOSPITAL_COMMUNITY)
Admission: RE | Admit: 2019-09-08 | Discharge: 2019-09-08 | Disposition: A | Discharge status: Home / Self Care | Payer: BC Managed Care – PPO | Source: Ambulatory Visit | Attending: Gastroenterology | Admitting: Gastroenterology

## 2019-09-08 ENCOUNTER — Other Ambulatory Visit: Payer: Self-pay | Admitting: Gastroenterology

## 2019-09-08 DIAGNOSIS — Z01812 Encounter for preprocedural laboratory examination: Secondary | ICD-10-CM | POA: Insufficient documentation

## 2019-09-08 DIAGNOSIS — Z20828 Contact with and (suspected) exposure to other viral communicable diseases: Secondary | ICD-10-CM | POA: Diagnosis not present

## 2019-09-08 LAB — SARS CORONAVIRUS 2 (TAT 6-24 HRS): SARS Coronavirus 2: NEGATIVE

## 2019-09-09 NOTE — Progress Notes (Signed)
Attempted pre op call for procedure 09/10/19, no answer automated voicemail, no message left.  

## 2019-09-10 ENCOUNTER — Other Ambulatory Visit: Payer: Self-pay

## 2019-09-10 ENCOUNTER — Encounter (HOSPITAL_COMMUNITY)
Admission: RE | Disposition: A | Discharge status: Home / Self Care | Payer: Self-pay | Source: Home / Self Care | Attending: Gastroenterology

## 2019-09-10 ENCOUNTER — Encounter (HOSPITAL_COMMUNITY): Payer: Self-pay

## 2019-09-10 ENCOUNTER — Ambulatory Visit (HOSPITAL_COMMUNITY)
Admission: RE | Admit: 2019-09-10 | Discharge: 2019-09-10 | Disposition: A | Discharge status: Home / Self Care | Payer: BC Managed Care – PPO | Attending: Gastroenterology | Admitting: Gastroenterology

## 2019-09-10 ENCOUNTER — Ambulatory Visit (HOSPITAL_COMMUNITY): Payer: BC Managed Care – PPO | Admitting: Anesthesiology

## 2019-09-10 DIAGNOSIS — J309 Allergic rhinitis, unspecified: Secondary | ICD-10-CM | POA: Diagnosis not present

## 2019-09-10 DIAGNOSIS — I10 Essential (primary) hypertension: Secondary | ICD-10-CM | POA: Diagnosis not present

## 2019-09-10 DIAGNOSIS — R1310 Dysphagia, unspecified: Secondary | ICD-10-CM | POA: Insufficient documentation

## 2019-09-10 DIAGNOSIS — R05 Cough: Secondary | ICD-10-CM | POA: Insufficient documentation

## 2019-09-10 DIAGNOSIS — K219 Gastro-esophageal reflux disease without esophagitis: Secondary | ICD-10-CM | POA: Insufficient documentation

## 2019-09-10 DIAGNOSIS — J45909 Unspecified asthma, uncomplicated: Secondary | ICD-10-CM | POA: Diagnosis not present

## 2019-09-10 HISTORY — PX: ESOPHAGOGASTRODUODENOSCOPY (EGD) WITH PROPOFOL: SHX5813

## 2019-09-10 HISTORY — PX: BIOPSY: SHX5522

## 2019-09-10 HISTORY — PX: SAVORY DILATION: SHX5439

## 2019-09-10 SURGERY — ESOPHAGOGASTRODUODENOSCOPY (EGD) WITH PROPOFOL
Anesthesia: Monitor Anesthesia Care

## 2019-09-10 MED ORDER — ALBUTEROL SULFATE HFA 108 (90 BASE) MCG/ACT IN AERS
INHALATION_SPRAY | RESPIRATORY_TRACT | Status: AC
  Filled 2019-09-10: qty 6.7

## 2019-09-10 MED ORDER — SUCCINYLCHOLINE CHLORIDE 20 MG/ML IJ SOLN
INTRAMUSCULAR | Status: DC | PRN
  Administered 2019-09-10: 160 mg via INTRAVENOUS

## 2019-09-10 MED ORDER — ALBUTEROL SULFATE HFA 108 (90 BASE) MCG/ACT IN AERS
INHALATION_SPRAY | RESPIRATORY_TRACT | Status: DC | PRN
  Administered 2019-09-10: 7 via RESPIRATORY_TRACT

## 2019-09-10 MED ORDER — LACTATED RINGERS IV SOLN
INTRAVENOUS | Status: DC
  Administered 2019-09-10: 1000 mL via INTRAVENOUS

## 2019-09-10 MED ORDER — PROPOFOL 500 MG/50ML IV EMUL
INTRAVENOUS | Status: AC
  Filled 2019-09-10: qty 50

## 2019-09-10 MED ORDER — LIDOCAINE HCL 1 % IJ SOLN
INTRAMUSCULAR | Status: DC | PRN
  Administered 2019-09-10 (×2): 40 mg via INTRADERMAL

## 2019-09-10 MED ORDER — PROPOFOL 10 MG/ML IV BOLUS
INTRAVENOUS | Status: AC
  Filled 2019-09-10: qty 20

## 2019-09-10 MED ORDER — PROPOFOL 10 MG/ML IV BOLUS
INTRAVENOUS | Status: DC | PRN
  Administered 2019-09-10: 20 mg via INTRAVENOUS

## 2019-09-10 MED ORDER — PROPOFOL 500 MG/50ML IV EMUL
INTRAVENOUS | Status: DC | PRN
  Administered 2019-09-10: 150 ug/kg/min via INTRAVENOUS

## 2019-09-10 MED ORDER — SODIUM CHLORIDE 0.9 % IV SOLN: INTRAVENOUS | Status: DC

## 2019-09-10 SURGICAL SUPPLY — 14 items

## 2019-09-10 NOTE — Discharge Instructions (Signed)

## 2019-09-10 NOTE — Transfer of Care (Signed)
Immediate Anesthesia Transfer of Care Note  Patient: Katherine Murphy  Procedure(s) Performed: ESOPHAGOGASTRODUODENOSCOPY (EGD) WITH PROPOFOL (N/A ) SAVORY DILATION (N/A )  Patient Location: PACU and Endoscopy Unit  Anesthesia Type:General  Level of Consciousness: awake, alert , oriented and patient cooperative  Airway & Oxygen Therapy: Patient Spontanous Breathing and Patient connected to face mask oxygen  Post-op Assessment: Report given to RN and Post -op Vital signs reviewed and stable  Post vital signs: Reviewed and stable  Last Vitals:  Vitals Value Taken Time  BP    Temp    Pulse    Resp    SpO2      Last Pain:  Vitals:   09/10/19 1112  TempSrc: Oral  PainSc: 0-No pain         Complications: No apparent anesthesia complications

## 2019-09-10 NOTE — Op Note (Signed)
[facility] Patient Name: Katherine Murphy Procedure Date: 09/10/2019 MRN: [id number] Attending MD: Carol Ada , MD Date of Birth: 1971/08/18 CSN: [id number] Age: 48 Admit Type: Inpatient Procedure:                Upper GI endoscopy Indications:              Chronic cough and Dysphagia. Providers:                Carol Ada, MD, Baird Cancer, RN, Ladona Ridgel,                            Technician Referring MD:              Medicines:                General Anesthesia Complications:            No immediate complications. Estimated Blood Loss:     Estimated blood loss was minimal. Procedure:                Pre-Anesthesia Assessment:                           - Prior to the procedure, a History and Physical                            was performed, and patient medications and                            allergies were reviewed. The patient's tolerance of                            previous anesthesia was also reviewed. The risks                            and benefits of the procedure and the sedation                            options and risks were discussed with the patient.                            All questions were answered, and informed consent                            was obtained. Prior Anticoagulants: The patient has                            taken no previous anticoagulant or antiplatelet                            agents. ASA Grade Assessment: III - A patient with                            severe systemic disease. After reviewing the risks  and benefits, the patient was deemed in                            satisfactory condition to undergo the procedure.                           - Sedation was administered by an anesthesia                            professional. Deep sedation was attained.                           After obtaining informed consent, the endoscope was                            passed under direct vision.  Throughout the                            procedure, the patient's blood pressure, pulse, and                            oxygen saturations were monitored continuously. The                            GIF-H190 ([id number] Olympus gastroscope was                            introduced through the mouth, and advanced to the                            second part of duodenum. The upper GI endoscopy was                            technically difficult and complex. The patient                            tolerated the procedure well. Scope In: Scope Out: Findings:      No endoscopic abnormality was evident in the esophagus to explain the       patient's complaint of dysphagia. It was decided, however, to proceed       with dilation of the entire esophagus. A guidewire was placed and the       scope was withdrawn. Dilation was performed with a Savary dilator with       no resistance at 16 mm. The dilation site was examined following       endoscope reinsertion and showed no change. Biopsies were taken with a       cold forceps for histology.      The stomach was normal.      The examined duodenum was normal.      Initially with propofol sedation the patient was coughing significantly.       Fluid was suctioned from the posterior pharynx and in the esophagus       there was a significant amount of refluxate, which was quickly       suctioned. At this time, she started  to desaturate into the low 70%.       Manual ventilation was performed by anesthesia and then the decision by       the anesthesiologist was to intubated. After intubation the procedure       was resumed without any difficulty. Because of the ET tube, a 16 mm       Savary, rather than an 18 mm Savary, was used to dilate the esophagus.       There was no evidence of any mucosal breaks. Random esophagel biopsies       were obtained. It is unclear if the refluxate was contributing to her       complaints of cough. Impression:                - No endoscopic esophageal abnormality to explain                            patient's dysphagia. Esophagus dilated. Dilated.                            Biopsied.                           - Normal stomach.                           - Normal examined duodenum. Moderate Sedation:      Not Applicable - Patient had care per Anesthesia. Recommendation:           - Patient has a contact number available for                            emergencies. The signs and symptoms of potential                            delayed complications were discussed with the                            patient. Return to normal activities tomorrow.                            Written discharge instructions were provided to the                            patient.                           - Resume regular diet.                           - Continue present medications.                           - Await pathology results.                           - Trial of gabapentin. This will be discussed with  Dr. Loreta Ave.                           - Codeine can be used in the short term.                           - pH Impedence may be required if she is not                            responsive to any of the medical interventions. Procedure Code(s):        --- Professional ---                           [phone], Esophagogastroduodenoscopy, flexible,                            transoral; with insertion of guide wire followed by                            passage of dilator(s) through esophagus over guide                            wire                           [id number], 59, Esophagogastroduodenoscopy, flexible,                            transoral; with biopsy, single or multiple Diagnosis Code(s):        --- Professional ---                           R13.10, Dysphagia, unspecified                           R05, Cough CPT copyright 2019 American Medical Association. All rights reserved. The codes documented  in this report are preliminary and upon coder review may  be revised to meet current compliance requirements. Jeani Hawking, MD Jeani Hawking, MD 09/10/2019 12:31:55 PM This report has been signed electronically. Number of Addenda: 0

## 2019-09-10 NOTE — Anesthesia Procedure Notes (Signed)
Procedure Name: Intubation Date/Time: 09/10/2019 12:06 PM Performed by: Garrel Ridgel, CRNA Pre-anesthesia Checklist: Patient identified, Emergency Drugs available, Suction available and Patient being monitored Patient Re-evaluated:Patient Re-evaluated prior to induction Oxygen Delivery Method: Circle system utilized Preoxygenation: Pre-oxygenation with 100% oxygen Induction Type: IV induction Ventilation: Mask ventilation without difficulty Tube type: Oral Number of attempts: 1 Airway Equipment and Method: Stylet and Oral airway Placement Confirmation: ETT inserted through vocal cords under direct vision,  positive ETCO2 and breath sounds checked- equal and bilateral Secured at: 22 (22) cm Tube secured with: Tape Dental Injury: Teeth and Oropharynx as per pre-operative assessment

## 2019-09-10 NOTE — Anesthesia Preprocedure Evaluation (Addendum)
Anesthesia Evaluation  Patient identified by MRN, date of birth, ID band Patient awake    Reviewed: Allergy & Precautions, NPO status , Patient's Chart, lab work & pertinent test results  Airway Mallampati: III  TM Distance: <3 FB Neck ROM: Full    Dental no notable dental hx.    Pulmonary neg pulmonary ROS,    Pulmonary exam normal  + wheezing      Cardiovascular hypertension, Normal cardiovascular exam Rhythm:Regular Rate:Normal  Untreated HTN   Neuro/Psych negative neurological ROS  negative psych ROS   GI/Hepatic Neg liver ROS, GERD  Medicated,  Endo/Other  Morbid obesity  Renal/GU negative Renal ROS  negative genitourinary   Musculoskeletal negative musculoskeletal ROS (+)   Abdominal   Peds negative pediatric ROS (+)  Hematology negative hematology ROS (+)   Anesthesia Other Findings   Reproductive/Obstetrics negative OB ROS                            Anesthesia Physical Anesthesia Plan  ASA: III  Anesthesia Plan: MAC   Post-op Pain Management:    Induction: Intravenous  PONV Risk Score and Plan:   Airway Management Planned: Simple Face Mask  Additional Equipment:   Intra-op Plan:   Post-operative Plan:   Informed Consent: I have reviewed the patients History and Physical, chart, labs and discussed the procedure including the risks, benefits and alternatives for the proposed anesthesia with the patient or authorized representative who has indicated his/her understanding and acceptance.     Dental advisory given  Plan Discussed with: CRNA and Surgeon  Anesthesia Plan Comments:        Anesthesia Quick Evaluation

## 2019-09-10 NOTE — Anesthesia Postprocedure Evaluation (Signed)
Anesthesia Post Note  Patient: Katherine Murphy  Procedure(s) Performed: ESOPHAGOGASTRODUODENOSCOPY (EGD) WITH PROPOFOL (N/A ) SAVORY DILATION (N/A )     Patient location during evaluation: PACU Anesthesia Type: General Level of consciousness: awake and alert Pain management: pain level controlled Vital Signs Assessment: post-procedure vital signs reviewed and stable Respiratory status: spontaneous breathing, nonlabored ventilation, respiratory function stable and patient connected to nasal cannula oxygen Cardiovascular status: blood pressure returned to baseline and stable Postop Assessment: no apparent nausea or vomiting Anesthetic complications: no    Last Vitals:  Vitals:   09/10/19 1235 09/10/19 1250  BP: (!) 193/107 (!) 223/63  Pulse:  (!) 113  Resp: 16 (!) 25  Temp: 37.3 C   SpO2: 99% 99%    Last Pain:  Vitals:   09/10/19 1250  TempSrc:   PainSc: 0-No pain                 Florentina Marquart S

## 2019-09-10 NOTE — H&P (Signed)
  Katherine Murphy HPI: This 48 year old white female presents to the office for evaluation of a persistent cough that started in her teens. She usually gets the cough 1-2 times a year starting in November but only last for a short time. This time she has had the cough for 1 year. The coughing has become so severe that she often has dry heaves. She has to clear her throat a lot and has a lot of mucus that she coughs up. She feels her chest hurts from coughing so much. She took 3-6 Ibuprofen a day for 10 days to see if it would help with the inflammation in her throat. She has not taken any Ibuprofen in 3 days. She has bouts of acid reflux once a month. She occasionally has difficulty swallowing solids and liquids. She has 1-2 BM's per day with no obvious blood or mucus in the stool. She has good appetite and her weight has been stable. She denies having any complaints of abdominal pain, nausea, vomiting, or odynophagia. She denies having a family history of colon cancer, celiac sprue or IBD.   Past Medical History:  Diagnosis Date  . Abnormal Pap smear     Past Surgical History:  Procedure Laterality Date  . CRYOTHERAPY    . KNEE SURGERY Left     History reviewed. No pertinent family history.  Social History:  reports that she has never smoked. She has never used smokeless tobacco. She reports that she does not drink alcohol or use drugs.  Allergies: No Known Allergies  Medications:  Scheduled:  Continuous: . sodium chloride    . lactated ringers 1,000 mL (09/10/19 1113)    No results found for this or any previous visit (from the past 24 hour(s)).   No results found.  ROS:  As stated above in the HPI otherwise negative.  Blood pressure (!) 177/88, pulse 77, temperature 98.2 F (36.8 C), temperature source Oral, resp. rate (!) 24, height 5\' 8"  (1.727 m), weight 128.8 kg, last menstrual period 09/01/2019, SpO2 98 %.    PE: Gen: NAD, Alert and Oriented HEENT:  [place]/AT, EOMI Neck:  Supple, no LAD Lungs: CTA Bilaterally CV: RRR without M/G/R ABM: Soft, NTND, +BS Ext: No C/C/E  Assessment/Plan: 1) Chronic cough. 2) Dysphagia.  Plan: 1) EGD with dilation.  Shandon Burlingame D 09/10/2019, 11:32 AM

## 2019-09-13 LAB — SURGICAL PATHOLOGY

## 2019-09-13 NOTE — Telephone Encounter (Signed)
Per review of referral notes, appt not scheduled to date.   Routing to [employer]  to f/u.

## 2019-09-22 NOTE — Telephone Encounter (Signed)
Per review of referral notes, patient was scheduled to see Dr. Collene Mares on 08/31/19.   Routing to [employer], CNM FYI.   Encounter closed.

## 2019-11-05 DIAGNOSIS — R7309 Other abnormal glucose: Secondary | ICD-10-CM

## 2019-11-05 HISTORY — DX: Other abnormal glucose: R73.09

## 2019-11-05 HISTORY — PX: LEEP: SHX91

## 2019-11-30 DIAGNOSIS — K219 Gastro-esophageal reflux disease without esophagitis: Secondary | ICD-10-CM | POA: Diagnosis not present

## 2019-11-30 DIAGNOSIS — R05 Cough: Secondary | ICD-10-CM | POA: Diagnosis not present

## 2020-01-24 ENCOUNTER — Encounter: Payer: Self-pay | Admitting: Certified Nurse Midwife

## 2020-03-14 DIAGNOSIS — Z0131 Encounter for examination of blood pressure with abnormal findings: Secondary | ICD-10-CM | POA: Diagnosis not present

## 2020-03-14 DIAGNOSIS — R030 Elevated blood-pressure reading, without diagnosis of hypertension: Secondary | ICD-10-CM | POA: Diagnosis not present

## 2020-07-24 ENCOUNTER — Other Ambulatory Visit: Payer: Self-pay

## 2020-07-24 DIAGNOSIS — Z3041 Encounter for surveillance of contraceptive pills: Secondary | ICD-10-CM

## 2020-07-24 MED ORDER — NORGESTIM-ETH ESTRAD TRIPHASIC 0.18/0.215/0.25 MG-35 MCG PO TABS: 1.0000 | ORAL_TABLET | Freq: Every day | ORAL | 0 refills | Status: DC

## 2020-07-24 NOTE — Telephone Encounter (Signed)
Medication refill request: Tri Previfem  Last AEX:  08/16/19 Next AEX: 08/18/20 Last MMG (if hormonal medication request): 11/20/18 Refill authorized: 84/0

## 2020-08-18 ENCOUNTER — Encounter: Payer: Self-pay | Admitting: Obstetrics and Gynecology

## 2020-08-18 ENCOUNTER — Ambulatory Visit: Payer: BC Managed Care – PPO | Admitting: Certified Nurse Midwife

## 2020-08-18 ENCOUNTER — Ambulatory Visit (INDEPENDENT_AMBULATORY_CARE_PROVIDER_SITE_OTHER): Payer: BC Managed Care – PPO | Admitting: Obstetrics and Gynecology

## 2020-08-18 ENCOUNTER — Other Ambulatory Visit: Payer: Self-pay

## 2020-08-18 ENCOUNTER — Other Ambulatory Visit (HOSPITAL_COMMUNITY)
Admission: RE | Admit: 2020-08-18 | Discharge: 2020-08-18 | Disposition: A | Discharge status: Home / Self Care | Payer: BC Managed Care – PPO | Source: Ambulatory Visit | Attending: Obstetrics and Gynecology | Admitting: Obstetrics and Gynecology

## 2020-08-18 VITALS — BP 118/70 | HR 84 | Resp 16 | Ht 67.5 in | Wt 278.0 lb

## 2020-08-18 DIAGNOSIS — R053 Chronic cough: Secondary | ICD-10-CM | POA: Diagnosis not present

## 2020-08-18 DIAGNOSIS — N841 Polyp of cervix uteri: Secondary | ICD-10-CM | POA: Diagnosis not present

## 2020-08-18 DIAGNOSIS — Z01419 Encounter for gynecological examination (general) (routine) without abnormal findings: Secondary | ICD-10-CM | POA: Insufficient documentation

## 2020-08-18 DIAGNOSIS — Z3041 Encounter for surveillance of contraceptive pills: Secondary | ICD-10-CM

## 2020-08-18 DIAGNOSIS — R748 Abnormal levels of other serum enzymes: Secondary | ICD-10-CM

## 2020-08-18 DIAGNOSIS — L409 Psoriasis, unspecified: Secondary | ICD-10-CM | POA: Diagnosis not present

## 2020-08-18 DIAGNOSIS — R7989 Other specified abnormal findings of blood chemistry: Secondary | ICD-10-CM

## 2020-08-18 MED ORDER — NORGESTIM-ETH ESTRAD TRIPHASIC 0.18/0.215/0.25 MG-35 MCG PO TABS: 1.0000 | ORAL_TABLET | Freq: Every day | ORAL | 0 refills | Status: DC

## 2020-08-18 NOTE — Progress Notes (Signed)
49 y.o. G26P0020 Single Caucasian female here for annual exam.    Menses controlled on COCs.  Has a chronic cough.  Having urinary incontinence with coughing.  Not too much leak with laugh or sneeze.   Did not see ENT last year as her symptoms stopped.  States her blood pressure at home is 140s/150s systolic.   Works for [employer].   Received her Covid vaccine.  No flu vaccine.  PCP:  No PCP   Patient's last menstrual period was 07/26/2020 (exact date).           Sexually active: Yes.    The current method of family planning is OCP (estrogen/progesterone).    Exercising: No.  The patient does not participate in regular exercise at present. Smoker:  no  Health Maintenance: Pap:  03-07-17 ASCUS HPV HR neg  05-06-18 neg HPV HR neg History of abnormal Pap:  Yes, cryo MMG:  11/20/18 BIRADS 1 negative/density c Colonoscopy:  n/a BMD:   n/a  Result  n/a TDaP:  2019 Gardasil:   no HIV and Hep C: negative in the past Screening Labs: discuss today   reports that she has never smoked. She has never used smokeless tobacco. She reports that she does not drink alcohol and does not use drugs.  Past Medical History:  Diagnosis Date  . Abnormal Pap smear   . Psoriasis     Past Surgical History:  Procedure Laterality Date  . BIOPSY  09/10/2019   Procedure: BIOPSY;  Surgeon: Jeani Hawking, MD;  Location: [facility];  Service: Endoscopy;;  . CRYOTHERAPY    . ESOPHAGOGASTRODUODENOSCOPY (EGD) WITH PROPOFOL N/A 09/10/2019   Procedure: ESOPHAGOGASTRODUODENOSCOPY (EGD) WITH PROPOFOL;  Surgeon: Jeani Hawking, MD;  Location: [facility];  Service: Endoscopy;  Laterality: N/A;  . KNEE SURGERY Left   . SAVORY DILATION N/A 09/10/2019   Procedure: SAVORY DILATION;  Surgeon: Jeani Hawking, MD;  Location: [facility];  Service: Endoscopy;  Laterality: N/A;    Current Outpatient Medications  Medication Sig Dispense Refill  . Norgestimate-Ethinyl Estradiol Triphasic (TRI-PREVIFEM)  0.18/0.215/0.25 MG-35 MCG tablet Take 1 tablet by mouth daily. 28 tablet 0   No current facility-administered medications for this visit.    History reviewed. No pertinent family history.  Review of Systems  Constitutional: Negative.   HENT: Negative.   Eyes: Negative.   Respiratory: Negative.   Cardiovascular: Negative.   Gastrointestinal: Negative.   Endocrine: Negative.   Genitourinary: Negative.   Musculoskeletal: Negative.   Skin: Negative.   Allergic/Immunologic: Negative.   Neurological: Negative.   Hematological: Negative.   Psychiatric/Behavioral: Negative.     Exam:   BP 118/70 (BP Location: Right Arm, Patient Position: Sitting, Cuff Size: Large)   Pulse 84   Resp 16   Ht 5' 7.5" (1.715 m)   Wt 278 lb (126.1 kg)   LMP 07/26/2020 (Exact Date)   BMI 42.90 kg/m     General appearance: alert, cooperative and appears stated age Head: normocephalic, without obvious abnormality, atraumatic Neck: no adenopathy, supple, symmetrical, trachea midline and thyroid normal to inspection and palpation Lungs: clear to auscultation bilaterally Breasts: normal appearance, no masses or tenderness, No nipple retraction or dimpling, No nipple discharge or bleeding, No axillary adenopathy Heart: regular rate and rhythm Abdomen: soft, non-tender; no masses, no organomegaly Extremities: extremities normal, atraumatic, no cyanosis or edema Skin: skin color, texture, turgor normal. No rashes or lesions Lymph nodes: cervical, supraclavicular, and axillary nodes normal. Neurologic: grossly normal  Pelvic: External genitalia:  no lesions              No abnormal inguinal nodes palpated.              Urethra:  normal appearing urethra with no masses, tenderness or lesions              Bartholins and Skenes: normal                 Vagina: normal appearing vagina with normal color and discharge, no lesions              Cervix:  2 cm fleshy polyp              Pap taken: Yes.   Bimanual  Exam:  Uterus:  normal size, contour, position, consistency, mobility, non-tender              Adnexa: no mass, fullness, tenderness              Rectal exam: Yes.  .  Confirms.              Anus:  normal sphincter tone, no lesions  Chaperone was present for exam.  Assessment:   Well woman visit with normal exam. ASCUS pap and neg HR HPV.  COCs. Chronic cough. Large cervical polyp. Psoriasis.  Plan: Mammogram screening discussed. Self breast awareness reviewed. Pap and HR HPV as above. Guidelines for Calcium, Vitamin D, regular exercise program including cardiovascular and weight bearing exercise. Referral to ENT.  Will need LEEP for removal of the polyp.  Referral to dermatology. Routine labs.  List of PCPs.  Refill COCs for one month.  When mammogram back and normal, can refill for one year.  If BP is elevated, she will need to come off her COCs and switch to nonestrogen containing birth control. Follow up annually and prn.   After visit summary provided.

## 2020-08-18 NOTE — Addendum Note (Signed)
Addended by: Ardell Isaacs, Selby Foisy E on: 08/18/2020 12:57 PM   Modules accepted: Orders

## 2020-08-18 NOTE — Patient Instructions (Signed)

## 2020-08-19 ENCOUNTER — Encounter: Payer: Self-pay | Admitting: Obstetrics and Gynecology

## 2020-08-19 LAB — COMPREHENSIVE METABOLIC PANEL
ALT: 13 IU/L (ref 0–32)
AST: 14 IU/L (ref 0–40)
Albumin/Globulin Ratio: 1.4 (ref 1.2–2.2)
Albumin: 4 g/dL (ref 3.8–4.8)
Alkaline Phosphatase: 124 IU/L — ABNORMAL HIGH (ref 44–121)
BUN/Creatinine Ratio: 12 (ref 9–23)
BUN: 10 mg/dL (ref 6–24)
Bilirubin Total: 0.3 mg/dL (ref 0.0–1.2)
CO2: 23 mmol/L (ref 20–29)
Calcium: 8.8 mg/dL (ref 8.7–10.2)
Chloride: 105 mmol/L (ref 96–106)
Creatinine, Ser: 0.81 mg/dL (ref 0.57–1.00)
GFR calc Af Amer: 99 mL/min/{1.73_m2} (ref >59)
GFR calc non Af Amer: 86 mL/min/{1.73_m2} (ref >59)
Globulin, Total: 2.9 g/dL (ref 1.5–4.5)
Glucose: 90 mg/dL (ref 65–99)
Potassium: 4.1 mmol/L (ref 3.5–5.2)
Sodium: 142 mmol/L (ref 134–144)
Total Protein: 6.9 g/dL (ref 6.0–8.5)

## 2020-08-19 LAB — HEMOGLOBIN A1C
Est. average glucose Bld gHb Est-mCnc: 117 mg/dL
Hgb A1c MFr Bld: 5.7 % — ABNORMAL HIGH (ref 4.8–5.6)

## 2020-08-19 LAB — LIPID PANEL
Chol/HDL Ratio: 2.4 ratio (ref 0.0–4.4)
Cholesterol, Total: 174 mg/dL (ref 100–199)
HDL: 74 mg/dL (ref >39)
LDL Chol Calc (NIH): 81 mg/dL (ref 0–99)
Triglycerides: 106 mg/dL (ref 0–149)
VLDL Cholesterol Cal: 19 mg/dL (ref 5–40)

## 2020-08-19 LAB — CBC
Hematocrit: 39.8 % (ref 34.0–46.6)
Hemoglobin: 12.9 g/dL (ref 11.1–15.9)
MCH: 27.4 pg (ref 26.6–33.0)
MCHC: 32.4 g/dL (ref 31.5–35.7)
MCV: 85 fL (ref 79–97)
Platelets: 345 10*3/uL (ref 150–450)
RBC: 4.7 x10E6/uL (ref 3.77–5.28)
RDW: 13.7 % (ref 11.7–15.4)
WBC: 10 10*3/uL (ref 3.4–10.8)

## 2020-08-19 LAB — VITAMIN D 25 HYDROXY (VIT D DEFICIENCY, FRACTURES): Vit D, 25-Hydroxy: 16.5 ng/mL — ABNORMAL LOW (ref 30.0–100.0)

## 2020-08-19 LAB — TSH: TSH: 1.61 u[IU]/mL (ref 0.450–4.500)

## 2020-08-19 NOTE — Addendum Note (Signed)
Addended by: Ardell Isaacs, Debbe Bales E on: 08/19/2020 09:34 AM   Modules accepted: Orders

## 2020-08-21 ENCOUNTER — Telehealth: Payer: Self-pay

## 2020-08-21 MED ORDER — VITAMIN D (ERGOCALCIFEROL) 1.25 MG (50000 UNIT) PO CAPS: 50000.0000 [IU] | ORAL_CAPSULE | ORAL | 0 refills | Status: DC

## 2020-08-21 NOTE — Telephone Encounter (Signed)
Spoke with pt. Pt given results and recommendations per Dr Quincy Simmonds. Pt agreeable and verbalized understanding. Pt states had Alk Phos last year with GI and was 154. Pt states has stopped using Tylenol as much. When checked last year, Pt states was taking 6 Tylenol a day, has stopped now. Pt asking how long to recover, reduce number and what to do to make normal number for Alk Phos.   Pt advised will review with Dr Quincy Simmonds and return call. Pt agreeable.   Rx Vit D 50K sent to pharmacy on file. # 12, 0RF and made lab appt on 11/13/20 at 845 am. Pt agreeable to date and time of appt.   Routing to Dr Quincy Simmonds.

## 2020-08-21 NOTE — Telephone Encounter (Signed)
Good that the alkaline phosphatase has reduced from last year.  There is no specific guideline or recommendation for how to reduce alkaline phosphatase, as multiple conditions can make it elevated.  Fortunately, it is now just above the normal range.  I recommend at least rechecking it again in 3 months.  When she establishes care to see a PCP about her elevated blood sugar, she can recheck the alkaline phosphatase also.

## 2020-08-21 NOTE — Telephone Encounter (Signed)
-----   Message from Patton Salles, MD sent at 08/19/2020  9:34 AM EDT ----- Please contact patient in follow up to her blood work.  Her vitamin D level is low, and I recommend she take vit D 50,000 IU every week for 3 months.  Please schedule a lab visit for 3 months to recheck this.  I will place a future order.   Her alkaline phosphatase was also slightly elevated, and we can recheck this in three months as well.   Her hemoglobin A1C was elevated, and she currently has prediabetes.  She can reduce risk of diabetes through a diet low in sugar and carbohydrate, exercise and weight loss.  We talked about her establishing care with a PCP, and it would be good for this person to follow her blood sugar.   Her thyroid function and blood counts are normal.

## 2020-08-21 NOTE — Telephone Encounter (Signed)
See phone note

## 2020-08-22 ENCOUNTER — Telehealth: Payer: Self-pay | Admitting: Obstetrics and Gynecology

## 2020-08-22 LAB — CYTOLOGY - PAP
Comment: NEGATIVE
Diagnosis: NEGATIVE
High risk HPV: NEGATIVE

## 2020-08-22 NOTE — Telephone Encounter (Signed)
Left message for pt to return call to triage RN. 

## 2020-08-22 NOTE — Telephone Encounter (Signed)
Please precert and schedule a LEEP procedure with me for a large cervical polyp noted at annual exam.   Patient had a normal pap and negative HR HPV testing.

## 2020-08-22 NOTE — Telephone Encounter (Signed)
Spoke with Katherine Murphy. Katherine Murphy given results and recommendations per Dr Quincy Simmonds. Katherine Murphy agreeable to recheck alk phos with vit D level at lab appt on 11/13/2020. Katherine Murphy verbalized understanding to date and time of appt. Katherine Murphy states has not made PCP appt yet.  Encounter closed.

## 2020-08-23 NOTE — Telephone Encounter (Signed)
Spoke with patient regarding benefits for recommended LEEP procedure. Patient acknowledges understanding of information presented. Patient is aware of cancellation policy. Patient scheduled appointment for 08/31/2020 at 1000AM with Dr. Edward Jolly. Encounter closed.

## 2020-08-23 NOTE — Telephone Encounter (Signed)
Left message for pt to return call to triage RN. 

## 2020-08-24 ENCOUNTER — Other Ambulatory Visit: Payer: Self-pay | Admitting: Obstetrics and Gynecology

## 2020-08-24 DIAGNOSIS — Z1231 Encounter for screening mammogram for malignant neoplasm of breast: Secondary | ICD-10-CM

## 2020-08-25 ENCOUNTER — Encounter: Payer: Self-pay | Admitting: Obstetrics and Gynecology

## 2020-08-29 NOTE — Progress Notes (Signed)
  Subjective:     Patient ID: Katherine Murphy, female   DOB: 18-Aug-1971, 49 y.o.   MRN: [id number]  HPI  Patient here today for LEEP procedure due to large cervical polyp.  She had a normal pap and negative HR HPV on 08/18/20.  Review of Systems  All other systems reviewed and are negative.   LMP: 08-23-20 Contraception: OCPs UPT:Neg     Objective:   Physical Exam   LEEP for cervical polyp removal. Consent for procedure. Grounding pad placed. Hibiclens prep. Local 1% lidocaine with epi 1:100,000. Lot 30.037-EV, exp 07/05/21. Coag and cutting on 40/40 watts.  Small loop used.  Cervical polyp to pathology.  Coagulation to base of polyp stalk. Monsel's applied.  No complications. Minimal EBL.    Assessment:     Cervical polyp removal.    Plan:     FU pathology report.  Instructions and precautions given.  FU in 4 weeks.

## 2020-08-31 ENCOUNTER — Other Ambulatory Visit: Payer: Self-pay

## 2020-08-31 ENCOUNTER — Telehealth: Payer: Self-pay

## 2020-08-31 ENCOUNTER — Other Ambulatory Visit (HOSPITAL_COMMUNITY)
Admission: RE | Admit: 2020-08-31 | Discharge: 2020-08-31 | Disposition: A | Discharge status: Home / Self Care | Payer: BC Managed Care – PPO | Source: Ambulatory Visit | Attending: Obstetrics and Gynecology | Admitting: Obstetrics and Gynecology

## 2020-08-31 ENCOUNTER — Ambulatory Visit (INDEPENDENT_AMBULATORY_CARE_PROVIDER_SITE_OTHER): Payer: BC Managed Care – PPO | Admitting: Obstetrics and Gynecology

## 2020-08-31 ENCOUNTER — Encounter: Payer: Self-pay | Admitting: Obstetrics and Gynecology

## 2020-08-31 DIAGNOSIS — N841 Polyp of cervix uteri: Secondary | ICD-10-CM

## 2020-08-31 DIAGNOSIS — N870 Mild cervical dysplasia: Secondary | ICD-10-CM

## 2020-08-31 HISTORY — DX: Mild cervical dysplasia: N87.0

## 2020-08-31 NOTE — Telephone Encounter (Signed)
Patient needs to be seen in 4 weeks (around 09/28/2020) for post LEEP check up. No available appointments.

## 2020-08-31 NOTE — Telephone Encounter (Signed)
Call placed to patient, left detailed message, ok per dpr, name identified on voicemail.  4 wk f/u OV scheduled for 09/26/20 at 4:30pm, arrive at 4:15pm. Return call to office if you need to make any changes to this appt.   Encounter closed.

## 2020-08-31 NOTE — Patient Instructions (Signed)
LEEP POST-PROCEDURE INSTRUCTIONS  1. You may take Ibuprofen, Aleve or Tylenol for pain if needed.  Cramping is normal.  2. You will have black and/or bloody discharge at first.  This will lighten and then turn clear before completely resolving.  This will take 2 to 3 weeks.  3. Put nothing in your vagina until the bleeding or discharge stops.  4. You need to call if there is any unusual draining, if the bleeding is heavy, or if you are concerned.  5. Shower or bathe as normal  6. We will call you within one week with results or we will discuss the results at your follow-up appointment if needed.

## 2020-09-04 LAB — SURGICAL PATHOLOGY

## 2020-09-06 ENCOUNTER — Encounter: Payer: Self-pay | Admitting: Obstetrics and Gynecology

## 2020-09-07 ENCOUNTER — Other Ambulatory Visit: Payer: Self-pay

## 2020-09-07 DIAGNOSIS — Z3041 Encounter for surveillance of contraceptive pills: Secondary | ICD-10-CM

## 2020-09-07 MED ORDER — NORGESTIM-ETH ESTRAD TRIPHASIC 0.18/0.215/0.25 MG-35 MCG PO TABS: 1.0000 | ORAL_TABLET | Freq: Every day | ORAL | 0 refills | Status: DC

## 2020-09-07 NOTE — Telephone Encounter (Signed)
-----   Message from Patton Salles, MD sent at 09/06/2020  9:10 AM EDT ----- Please let patient know that her cervical polyp contained low grade dysplasia, which are low grade abnormal cells.  This is not cancer. Her next pap and HR HPV testing will be due in 12 months.  Please change her recall to 12 months.   I recommend she keep her appointment for her 4 week recheck.

## 2020-09-07 NOTE — Telephone Encounter (Signed)
Spoke with pt. Pt given results and recommendations per Dr Edward Jolly. Pt agreeable and verbalized understanding.  Pt aware of next LEEP appt on 09/26/20 and plans to keep.  Pt has scheduled next AEX 08/23/2021, 12 recall placed    Pt also asking about refill for OCPs. Pt has next MMG scheduled at the [facility] on 09/22/20 and will run out of pills on 09/17/20.  Pt asking for 1 month refill before MMG appt  Advised will send to Dr Edward Jolly for approval. Pt agreeable.   # 28, 0RF pended. Pharmacy verified.   Routing to Dr Edward Jolly

## 2020-09-22 ENCOUNTER — Other Ambulatory Visit: Payer: Self-pay

## 2020-09-22 ENCOUNTER — Ambulatory Visit
Admission: RE | Admit: 2020-09-22 | Discharge: 2020-09-22 | Disposition: A | Discharge status: Home / Self Care | Payer: BC Managed Care – PPO | Source: Ambulatory Visit | Attending: Obstetrics and Gynecology | Admitting: Obstetrics and Gynecology

## 2020-09-22 DIAGNOSIS — Z1231 Encounter for screening mammogram for malignant neoplasm of breast: Secondary | ICD-10-CM | POA: Diagnosis not present

## 2020-09-25 NOTE — Progress Notes (Signed)
GYNECOLOGY  VISIT   HPI: 49 y.o.   Single  Caucasian  female   G2P0020 with Patient's last menstrual period was 09/20/2020.   here for 4 week f/u after LEEP, which was performed to remove a large fleshy cervical polyp.  Her cervical polyp had LGSIL in it.  Her pap on 08/18/20 was normal with negative HR HPV.  No bleeding after the procedure.   Menses was light for her.   Notes an area on her bottom.   GYNECOLOGIC HISTORY: Patient's last menstrual period was 09/20/2020. Contraception:  OCP Menopausal hormone therapy:  n/a Last mammogram:  09/22/20 Last pap smear:   08/18/20 Neg:Neg HR HPV         05/06/18 Neg:Neg HR HPV        OB History    Gravida  2   Para  0   Term  0   Preterm      AB  2   Living  0     SAB      TAB  2   Ectopic      Multiple      Live Births                 Patient Active Problem List   Diagnosis Date Noted  . Allergic rhinitis 04/21/2014  . Uncomplicated asthma 04/21/2014    Past Medical History:  Diagnosis Date  . Abnormal Pap smear   . Elevated hemoglobin A1c 2021  . Psoriasis     Past Surgical History:  Procedure Laterality Date  . BIOPSY  09/10/2019   Procedure: BIOPSY;  Surgeon: Jeani Hawking, MD;  Location: [facility];  Service: Endoscopy;;  . CRYOTHERAPY    . ESOPHAGOGASTRODUODENOSCOPY (EGD) WITH PROPOFOL N/A 09/10/2019   Procedure: ESOPHAGOGASTRODUODENOSCOPY (EGD) WITH PROPOFOL;  Surgeon: Jeani Hawking, MD;  Location: [facility];  Service: Endoscopy;  Laterality: N/A;  . KNEE SURGERY Left   . LEEP  2021   LGSIL in large cervical polyp  . SAVORY DILATION N/A 09/10/2019   Procedure: SAVORY DILATION;  Surgeon: Jeani Hawking, MD;  Location: [facility];  Service: Endoscopy;  Laterality: N/A;    Current Outpatient Medications  Medication Sig Dispense Refill  . Norgestimate-Ethinyl Estradiol Triphasic (TRI-PREVIFEM) 0.18/0.215/0.25 MG-35 MCG tablet Take 1 tablet by mouth daily. 28 tablet 0  . Vitamin D,  Ergocalciferol, (DRISDOL) 1.25 MG (50000 UNIT) CAPS capsule Take 1 capsule (50,000 Units total) by mouth every 7 (seven) days. 12 capsule 0   No current facility-administered medications for this visit.     ALLERGIES: Patient has no known allergies.  History reviewed. No pertinent family history.  Social History   Socioeconomic History  . Marital status: Single    Spouse name: Not on file  . Number of children: Not on file  . Years of education: Not on file  . Highest education level: Not on file  Occupational History  . Not on file  Tobacco Use  . Smoking status: Never Smoker  . Smokeless tobacco: Never Used  Vaping Use  . Vaping Use: Never used  Substance and Sexual Activity  . Alcohol use: No  . Drug use: No  . Sexual activity: Yes    Partners: Male    Birth control/protection: Pill  Other Topics Concern  . Not on file  Social History Narrative  . Not on file   Social Determinants of Health   Financial Resource Strain:   . Difficulty of Paying Living Expenses: Not on file  Food  Insecurity:   . Worried About Programme researcher, broadcasting/film/video in the Last Year: Not on file  . Ran Out of Food in the Last Year: Not on file  Transportation Needs:   . Lack of Transportation (Medical): Not on file  . Lack of Transportation (Non-Medical): Not on file  Physical Activity:   . Days of Exercise per Week: Not on file  . Minutes of Exercise per Session: Not on file  Stress:   . Feeling of Stress : Not on file  Social Connections:   . Frequency of Communication with Friends and Family: Not on file  . Frequency of Social Gatherings with Friends and Family: Not on file  . Attends Religious Services: Not on file  . Active Member of Clubs or Organizations: Not on file  . Attends Banker Meetings: Not on file  . Marital Status: Not on file  Intimate Partner Violence:   . Fear of Current or Ex-Partner: Not on file  . Emotionally Abused: Not on file  . Physically Abused: Not on  file  . Sexually Abused: Not on file    Review of Systems  Constitutional: Negative.   HENT: Negative.   Eyes: Negative.   Respiratory: Negative.   Cardiovascular: Negative.   Gastrointestinal: Negative.   Endocrine: Negative.   Genitourinary: Negative.   Musculoskeletal: Negative.   Skin: Negative.   Allergic/Immunologic: Negative.   Neurological: Negative.   Hematological: Negative.   Psychiatric/Behavioral: Negative.     PHYSICAL EXAMINATION:    BP 132/78 (BP Location: Right Arm, Patient Position: Sitting, Cuff Size: Normal)   Pulse 84   Resp 16   Ht 5' 7.5" (1.715 m)   Wt 276 lb (125.2 kg)   LMP 09/20/2020   BMI 42.59 kg/m     General appearance: alert, cooperative and appears stated age  Pelvic: External genitalia:  no lesions              Urethra:  normal appearing urethra with no masses, tenderness or lesions              Bartholins and Skenes: normal                 Vagina: normal appearing vagina with normal color and discharge, no lesions              Cervix: well healed.                Bimanual Exam:  Uterus:  normal size, contour, position, consistency, mobility, non-tender              Adnexa: no mass, fullness, tenderness           Left buttock with 5 mm mild grey nevus.  Chaperone was present for exam.  ASSESSMENT  Status post LEEP for cervical polypectomy. LGSIL. Nevus.  PLAN  LEEP findings reviewed with patient.  Cervical cancer screening in 1 year. Start OCPs after mammogram back. Return for excision of buttock nevus if desired.   20 min  total time was spent for this patient encounter, including preparation, face-to-face counseling with the patient, coordination of care, and documentation of the encounter.

## 2020-09-26 ENCOUNTER — Other Ambulatory Visit: Payer: Self-pay

## 2020-09-26 ENCOUNTER — Ambulatory Visit (INDEPENDENT_AMBULATORY_CARE_PROVIDER_SITE_OTHER): Payer: BC Managed Care – PPO | Admitting: Obstetrics and Gynecology

## 2020-09-26 ENCOUNTER — Encounter: Payer: Self-pay | Admitting: Obstetrics and Gynecology

## 2020-09-26 VITALS — BP 132/78 | HR 84 | Resp 16 | Ht 67.5 in | Wt 276.0 lb

## 2020-09-26 DIAGNOSIS — Z9889 Other specified postprocedural states: Secondary | ICD-10-CM

## 2020-09-26 DIAGNOSIS — N870 Mild cervical dysplasia: Secondary | ICD-10-CM | POA: Diagnosis not present

## 2020-09-26 DIAGNOSIS — N841 Polyp of cervix uteri: Secondary | ICD-10-CM

## 2020-09-29 ENCOUNTER — Encounter: Payer: Self-pay | Admitting: Obstetrics and Gynecology

## 2020-10-15 ENCOUNTER — Other Ambulatory Visit: Payer: Self-pay | Admitting: Obstetrics and Gynecology

## 2020-10-15 DIAGNOSIS — Z3041 Encounter for surveillance of contraceptive pills: Secondary | ICD-10-CM

## 2020-10-16 NOTE — Telephone Encounter (Signed)
Medication refill request: Tri-Previfem Last AEX:  08/18/20 Dr. Edward Jolly Next AEX: 08/23/21 Last MMG (if hormonal medication request): 09/22/20 BIRADS 1 negative/density c Refill authorized: today, please advise

## 2020-11-12 ENCOUNTER — Other Ambulatory Visit: Payer: Self-pay | Admitting: Obstetrics and Gynecology

## 2020-11-13 ENCOUNTER — Other Ambulatory Visit: Payer: Self-pay

## 2020-11-13 ENCOUNTER — Other Ambulatory Visit (INDEPENDENT_AMBULATORY_CARE_PROVIDER_SITE_OTHER): Payer: BC Managed Care – PPO

## 2020-11-13 DIAGNOSIS — E559 Vitamin D deficiency, unspecified: Secondary | ICD-10-CM

## 2020-11-13 DIAGNOSIS — R7989 Other specified abnormal findings of blood chemistry: Secondary | ICD-10-CM

## 2020-11-13 DIAGNOSIS — R748 Abnormal levels of other serum enzymes: Secondary | ICD-10-CM

## 2020-11-15 ENCOUNTER — Other Ambulatory Visit: Payer: Self-pay | Admitting: *Deleted

## 2020-11-15 LAB — COMPREHENSIVE METABOLIC PANEL
ALT: 12 IU/L (ref 0–32)
AST: 14 IU/L (ref 0–40)
Albumin/Globulin Ratio: 1.2 (ref 1.2–2.2)
Albumin: 3.6 g/dL — ABNORMAL LOW (ref 3.8–4.8)
Alkaline Phosphatase: 114 IU/L (ref 44–121)
BUN/Creatinine Ratio: 9 (ref 9–23)
BUN: 7 mg/dL (ref 6–24)
Bilirubin Total: 0.2 mg/dL (ref 0.0–1.2)
CO2: 20 mmol/L (ref 20–29)
Calcium: 8.7 mg/dL (ref 8.7–10.2)
Chloride: 106 mmol/L (ref 96–106)
Creatinine, Ser: 0.79 mg/dL (ref 0.57–1.00)
GFR calc Af Amer: 102 mL/min/{1.73_m2} (ref >59)
GFR calc non Af Amer: 88 mL/min/{1.73_m2} (ref >59)
Globulin, Total: 3.1 g/dL (ref 1.5–4.5)
Glucose: 145 mg/dL — ABNORMAL HIGH (ref 65–99)
Potassium: 3.8 mmol/L (ref 3.5–5.2)
Sodium: 142 mmol/L (ref 134–144)
Total Protein: 6.7 g/dL (ref 6.0–8.5)

## 2020-11-15 LAB — VITAMIN D 25 HYDROXY (VIT D DEFICIENCY, FRACTURES): Vit D, 25-Hydroxy: 25.1 ng/mL — ABNORMAL LOW (ref 30.0–100.0)

## 2020-12-04 DIAGNOSIS — L409 Psoriasis, unspecified: Secondary | ICD-10-CM | POA: Insufficient documentation

## 2020-12-04 DIAGNOSIS — D2262 Melanocytic nevi of left upper limb, including shoulder: Secondary | ICD-10-CM | POA: Diagnosis not present

## 2020-12-04 DIAGNOSIS — L918 Other hypertrophic disorders of the skin: Secondary | ICD-10-CM | POA: Diagnosis not present

## 2020-12-04 DIAGNOSIS — L400 Psoriasis vulgaris: Secondary | ICD-10-CM | POA: Diagnosis not present

## 2020-12-04 DIAGNOSIS — D225 Melanocytic nevi of trunk: Secondary | ICD-10-CM | POA: Diagnosis not present

## 2021-02-01 DIAGNOSIS — L400 Psoriasis vulgaris: Secondary | ICD-10-CM | POA: Diagnosis not present

## 2021-03-21 ENCOUNTER — Telehealth: Payer: Self-pay | Admitting: Obstetrics and Gynecology

## 2021-03-21 ENCOUNTER — Other Ambulatory Visit: Payer: Self-pay | Admitting: Obstetrics and Gynecology

## 2021-03-21 DIAGNOSIS — R7989 Other specified abnormal findings of blood chemistry: Secondary | ICD-10-CM

## 2021-03-21 NOTE — Telephone Encounter (Signed)
Left detailed message on patient voicemail to call and schedule lab appointment.

## 2021-03-21 NOTE — Telephone Encounter (Signed)
Please contact patient to make a lab appointment for a vit D recheck.  She has been on high dose vit D prescription.  She is due to have her level rechecked.

## 2021-03-25 DIAGNOSIS — J40 Bronchitis, not specified as acute or chronic: Secondary | ICD-10-CM | POA: Diagnosis not present

## 2021-03-25 DIAGNOSIS — J3089 Other allergic rhinitis: Secondary | ICD-10-CM | POA: Diagnosis not present

## 2021-03-25 DIAGNOSIS — Z1152 Encounter for screening for COVID-19: Secondary | ICD-10-CM | POA: Diagnosis not present

## 2021-06-20 DIAGNOSIS — J3089 Other allergic rhinitis: Secondary | ICD-10-CM | POA: Diagnosis not present

## 2021-06-20 DIAGNOSIS — J302 Other seasonal allergic rhinitis: Secondary | ICD-10-CM | POA: Diagnosis not present

## 2021-06-20 DIAGNOSIS — J45991 Cough variant asthma: Secondary | ICD-10-CM | POA: Diagnosis not present

## 2021-06-20 DIAGNOSIS — Z87898 Personal history of other specified conditions: Secondary | ICD-10-CM | POA: Diagnosis not present

## 2021-07-02 ENCOUNTER — Telehealth: Payer: Self-pay

## 2021-07-02 DIAGNOSIS — Z3041 Encounter for surveillance of contraceptive pills: Secondary | ICD-10-CM

## 2021-07-02 MED ORDER — NORGESTIM-ETH ESTRAD TRIPHASIC 0.18/0.215/0.25 MG-35 MCG PO TABS: 1.0000 | ORAL_TABLET | Freq: Every day | ORAL | 0 refills | Status: DC

## 2021-07-02 NOTE — Telephone Encounter (Signed)
Last AEX 08/18/20. Scheduled AEX10/20/22.  Patient is calling for refill on bcps until visit and they have run out.

## 2021-07-03 ENCOUNTER — Other Ambulatory Visit: Payer: Self-pay

## 2021-07-03 DIAGNOSIS — Z3041 Encounter for surveillance of contraceptive pills: Secondary | ICD-10-CM

## 2021-07-03 MED ORDER — NORGESTIM-ETH ESTRAD TRIPHASIC 0.18/0.215/0.25 MG-35 MCG PO TABS: 1.0000 | ORAL_TABLET | Freq: Every day | ORAL | 0 refills | Status: DC

## 2021-07-03 NOTE — Telephone Encounter (Signed)
Patient said refill still not at pharmacy. Upon review I notice the Rx was set to "print".  I did not intend for it to print. I resent it normal and it did show "receipt confirmed by the pharmacy" this morning. Patient informed.

## 2021-08-23 ENCOUNTER — Encounter: Payer: Self-pay | Admitting: Obstetrics and Gynecology

## 2021-08-23 ENCOUNTER — Other Ambulatory Visit: Payer: Self-pay

## 2021-08-23 ENCOUNTER — Ambulatory Visit (INDEPENDENT_AMBULATORY_CARE_PROVIDER_SITE_OTHER): Payer: BC Managed Care – PPO | Admitting: Obstetrics and Gynecology

## 2021-08-23 ENCOUNTER — Other Ambulatory Visit (HOSPITAL_COMMUNITY)
Admission: RE | Admit: 2021-08-23 | Discharge: 2021-08-23 | Disposition: A | Discharge status: Home / Self Care | Payer: BC Managed Care – PPO | Source: Ambulatory Visit | Attending: Obstetrics and Gynecology | Admitting: Obstetrics and Gynecology

## 2021-08-23 VITALS — BP 132/80 | HR 72 | Resp 14 | Ht 67.0 in | Wt 238.0 lb

## 2021-08-23 DIAGNOSIS — Z124 Encounter for screening for malignant neoplasm of cervix: Secondary | ICD-10-CM | POA: Diagnosis not present

## 2021-08-23 DIAGNOSIS — Z3041 Encounter for surveillance of contraceptive pills: Secondary | ICD-10-CM

## 2021-08-23 DIAGNOSIS — N870 Mild cervical dysplasia: Secondary | ICD-10-CM | POA: Diagnosis not present

## 2021-08-23 DIAGNOSIS — Z01419 Encounter for gynecological examination (general) (routine) without abnormal findings: Secondary | ICD-10-CM

## 2021-08-23 MED ORDER — NORGESTIM-ETH ESTRAD TRIPHASIC 0.18/0.215/0.25 MG-35 MCG PO TABS: 1.0000 | ORAL_TABLET | Freq: Every day | ORAL | 3 refills | Status: DC

## 2021-08-23 NOTE — Progress Notes (Signed)
50 y.o. G61P0020 Single Caucasian female here for annual exam.    Feels really good during her menses.   Lost 45 pounds doing Actavia.   After her Covid vaccine, she developed increased psoriasis.   Wants to continue her COCs.   Declines STD testing.   Leaks urine if she gets up out of her recliner chair.  No leak with cough or sneeze.   PCP:   None per patient  Patient's last menstrual period was 08/23/2021.     Period Cycle (Days): 28 Period Duration (Days): 5-6 Period Pattern: Regular Menstrual Flow: Moderate Menstrual Control: Maxi pad Menstrual Control Change Freq (Hours): changes pad 1-2 times a day Dysmenorrhea: None     Sexually active: Yes.    The current method of family planning is OCP (estrogen/progesterone).    Exercising: No.  The patient does not participate in regular exercise at present. Smoker:  no  Health Maintenance: Pap: 08-18-20 Neg:Neg HR HPV, 03-07-17 ASCUS:Neg HR HPV, 05-06-18 Neg:Neg HR HPV Had CIN I on cervical polyp 08-31-20 History of abnormal Pap:  yes, Hx Cryotherapy to cervix MMG: 09-22-20  3D/Neg/BiRads1 Colonoscopy:  none.  Sees Dr. Loreta Ave and [place].  BMD:   none  Result  n/a TDaP:  2019 Gardasil:   no HIV: Neg in the past Hep C: Neg in the past Screening Labs:  today Did Covid vaccine x 2.   Developed worsening of her psoriasis and so she declines more.  Flu vaccine:  declined.    reports that she has never smoked. She has never used smokeless tobacco. She reports that she does not drink alcohol and does not use drugs.  Past Medical History:  Diagnosis Date   Abnormal Pap smear    Elevated hemoglobin A1c 2021   Psoriasis     Past Surgical History:  Procedure Laterality Date   BIOPSY  09/10/2019   Procedure: BIOPSY;  Surgeon: Jeani Hawking, MD;  Location: [facility];  Service: Endoscopy;;   CRYOTHERAPY     ESOPHAGOGASTRODUODENOSCOPY (EGD) WITH PROPOFOL N/A 09/10/2019   Procedure: ESOPHAGOGASTRODUODENOSCOPY (EGD) WITH PROPOFOL;   Surgeon: Jeani Hawking, MD;  Location: [facility];  Service: Endoscopy;  Laterality: N/A;   KNEE SURGERY Left    LEEP  2021   LGSIL in large cervical polyp   SAVORY DILATION N/A 09/10/2019   Procedure: SAVORY DILATION;  Surgeon: Jeani Hawking, MD;  Location: [facility];  Service: Endoscopy;  Laterality: N/A;    Current Outpatient Medications  Medication Sig Dispense Refill   Norgestimate-Ethinyl Estradiol Triphasic (TRI-ESTARYLLA) 0.18/0.215/0.25 MG-35 MCG tablet Take 1 tablet by mouth daily. 84 tablet 0   No current facility-administered medications for this visit.    History reviewed. No pertinent family history.  Review of Systems  All other systems reviewed and are negative.  Exam:   BP 132/80 (BP Location: Left Arm, Patient Position: Sitting, Cuff Size: Large)   Pulse 72   Resp 14   Ht 5\' 7"  (1.702 m)   Wt 238 lb (108 kg)   LMP 08/23/2021   BMI 37.28 kg/m     General appearance: alert, cooperative and appears stated age Head: normocephalic, without obvious abnormality, atraumatic Neck: no adenopathy, supple, symmetrical, trachea midline and thyroid normal to inspection and palpation Lungs: clear to auscultation bilaterally Breasts: normal appearance, no masses or tenderness, No nipple retraction or dimpling, No nipple discharge or bleeding, No axillary adenopathy Heart: regular rate and rhythm Abdomen: soft, non-tender; no masses, no organomegaly Extremities: extremities normal, atraumatic, no cyanosis or  edema Skin: skin color, texture, turgor normal. No rashes or lesions Lymph nodes: cervical, supraclavicular, and axillary nodes normal. Neurologic: grossly normal  Pelvic: External genitalia:  no lesions              No abnormal inguinal nodes palpated.              Urethra:  normal appearing urethra with no masses, tenderness or lesions              Bartholins and Skenes: normal                 Vagina: normal appearing vagina with normal color and discharge, no  lesions              Cervix: no lesions.  Dark clot in vagina.              Pap taken: yes Bimanual Exam:  Uterus:  normal size, contour, position, consistency, mobility, non-tender              Adnexa: no mass, fullness, tenderness              Rectal exam: yes.  Confirms.              Anus:  normal sphincter tone, no lesions  Chaperone was present for exam:  Irving Burton, RN  Assessment:   Well woman visit with gynecologic exam. LGSIL of cervical polyp. On COCs. Hx low vit D.  Hx elevated A1C.  Hx chronic cough. Psoriasis.  GSI, mild. Successful weight loss.  Plan: Mammogram screening discussed. Self breast awareness reviewed. Pap and HR HPV collected Guidelines for Calcium, Vitamin D, regular exercise program including cardiovascular and weight bearing exercise. Routine labs. Refill of COCs for one year. Kegel's discussed.  She will contact Dr. Loreta Ave or Dr. Elnoria Howard to schedule colonoscopy. Follow up annually and prn.   After visit summary provided.

## 2021-08-23 NOTE — Patient Instructions (Signed)

## 2021-08-24 LAB — CBC
HCT: 42.3 % (ref 35.0–45.0)
Hemoglobin: 13.9 g/dL (ref 11.7–15.5)
MCH: 28 pg (ref 27.0–33.0)
MCHC: 32.9 g/dL (ref 32.0–36.0)
MCV: 85.1 fL (ref 80.0–100.0)
MPV: 12.2 fL (ref 7.5–12.5)
Platelets: 330 10*3/uL (ref 140–400)
RBC: 4.97 10*6/uL (ref 3.80–5.10)
RDW: 13.1 % (ref 11.0–15.0)
WBC: 9.3 10*3/uL (ref 3.8–10.8)

## 2021-08-24 LAB — COMPREHENSIVE METABOLIC PANEL
AG Ratio: 1.3 (calc) (ref 1.0–2.5)
ALT: 14 U/L (ref 6–29)
AST: 13 U/L (ref 10–35)
Albumin: 4 g/dL (ref 3.6–5.1)
Alkaline phosphatase (APISO): 104 U/L (ref 37–153)
BUN: 12 mg/dL (ref 7–25)
CO2: 26 mmol/L (ref 20–32)
Calcium: 9.5 mg/dL (ref 8.6–10.4)
Chloride: 102 mmol/L (ref 98–110)
Creat: 0.7 mg/dL (ref 0.50–1.03)
Globulin: 3.2 g/dL (calc) (ref 1.9–3.7)
Glucose, Bld: 80 mg/dL (ref 65–99)
Potassium: 3.9 mmol/L (ref 3.5–5.3)
Sodium: 139 mmol/L (ref 135–146)
Total Bilirubin: 0.4 mg/dL (ref 0.2–1.2)
Total Protein: 7.2 g/dL (ref 6.1–8.1)

## 2021-08-24 LAB — VITAMIN D 25 HYDROXY (VIT D DEFICIENCY, FRACTURES): Vit D, 25-Hydroxy: 45 ng/mL (ref 30–100)

## 2021-08-24 LAB — HEMOGLOBIN A1C
Hgb A1c MFr Bld: 5.2 % of total Hgb (ref <5.7)
Mean Plasma Glucose: 103 mg/dL
eAG (mmol/L): 5.7 mmol/L

## 2021-08-24 LAB — CYTOLOGY - PAP
Comment: NEGATIVE
Diagnosis: NEGATIVE
High risk HPV: NEGATIVE

## 2021-08-24 LAB — LIPID PANEL
Cholesterol: 180 mg/dL (ref <200)
HDL: 82 mg/dL (ref >=50)
LDL Cholesterol (Calc): 82 mg/dL (calc)
Non-HDL Cholesterol (Calc): 98 mg/dL (calc) (ref <130)
Total CHOL/HDL Ratio: 2.2 (calc) (ref <5.0)
Triglycerides: 75 mg/dL (ref <150)

## 2021-08-24 LAB — TSH: TSH: 1.51 mIU/L

## 2021-08-26 ENCOUNTER — Encounter: Payer: Self-pay | Admitting: Obstetrics and Gynecology

## 2021-12-21 IMAGING — MG DIGITAL SCREENING BILAT W/ TOMO W/ CAD
8 series · 8 of 24 positions shown · non-contrast
Comparison: Previous exam(s).

CLINICAL DATA: Screening.

EXAM:
DIGITAL SCREENING BILATERAL MAMMOGRAM WITH TOMO AND CAD

[L MLO synth-2D]
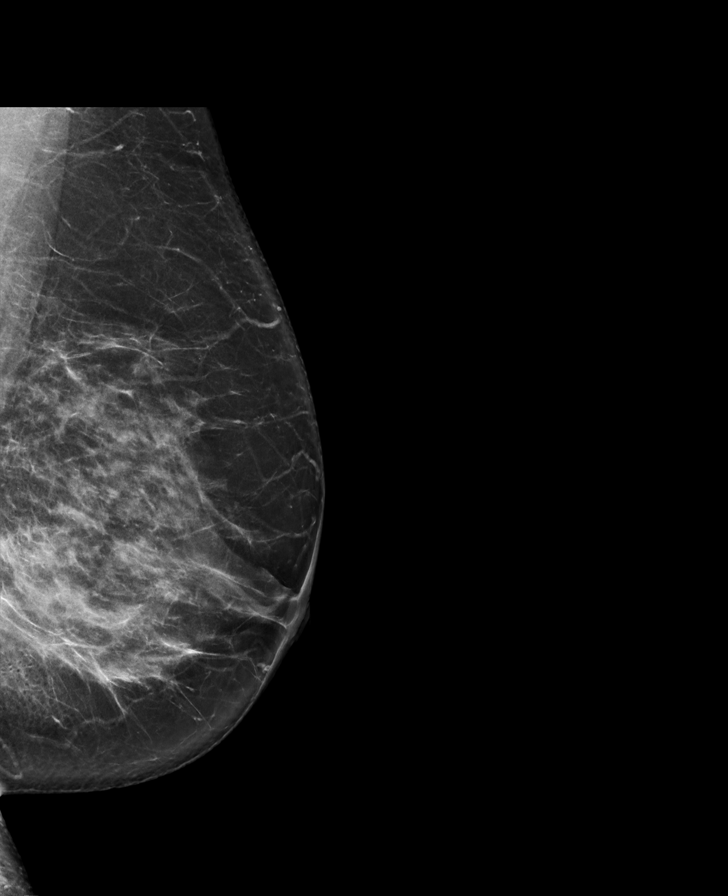

[R CC synth-2D]
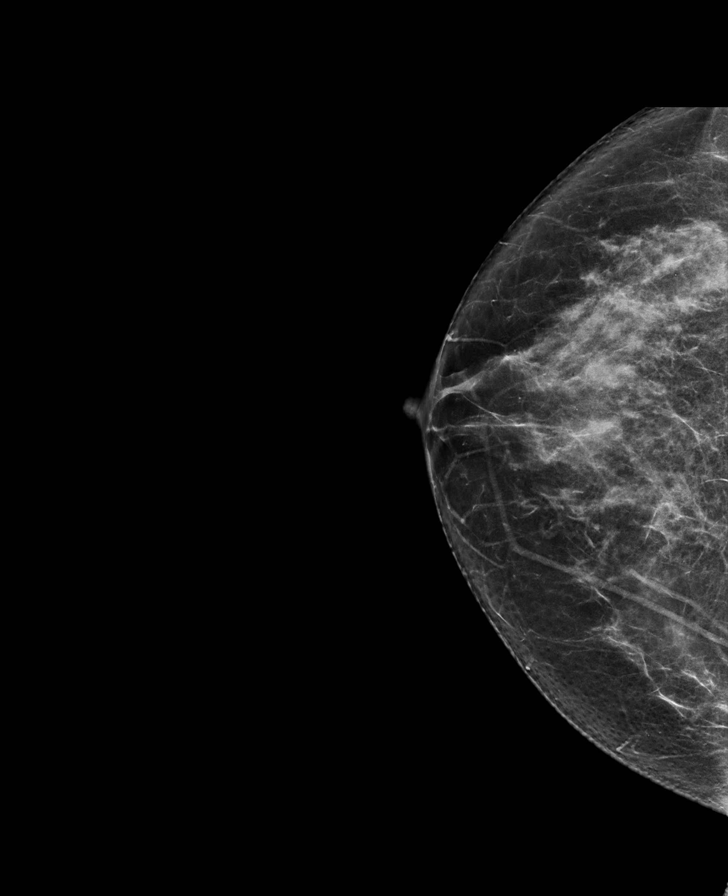

[R MLO synth-2D]
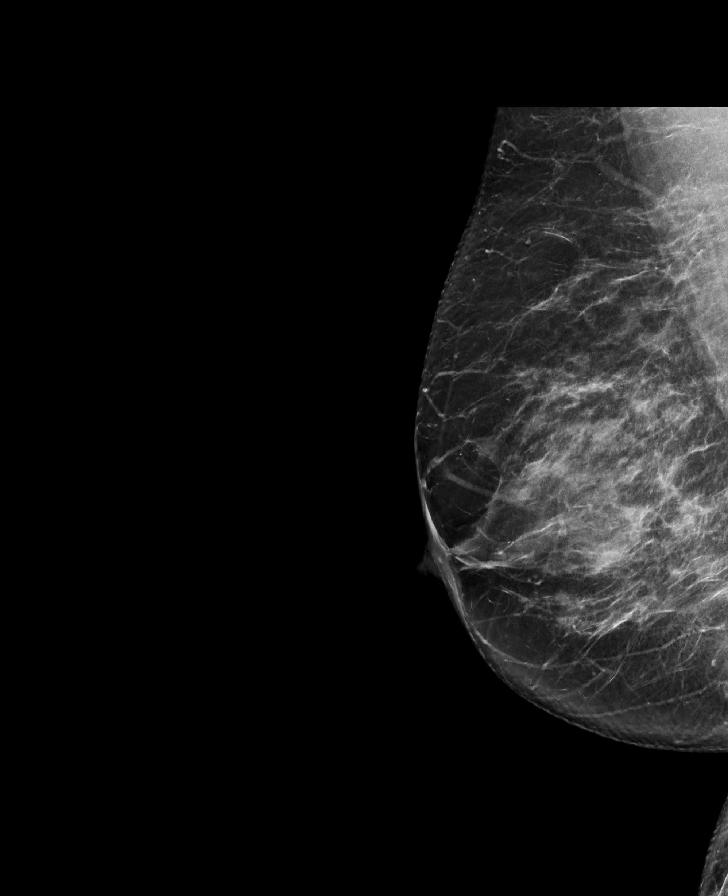

[L CC synth-2D]
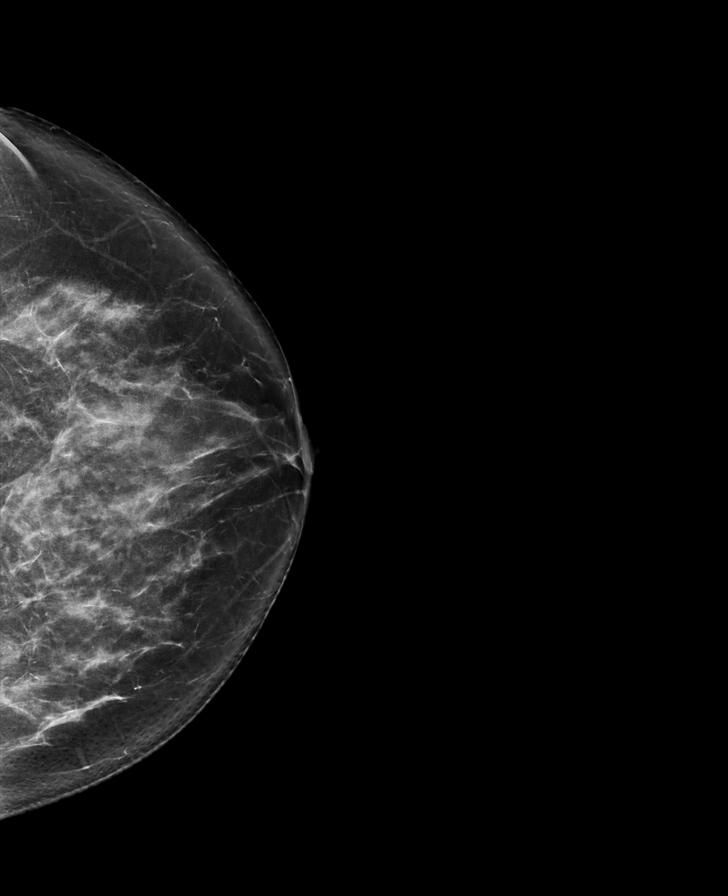

[L CC tomo · tomo slice 42/83.0]
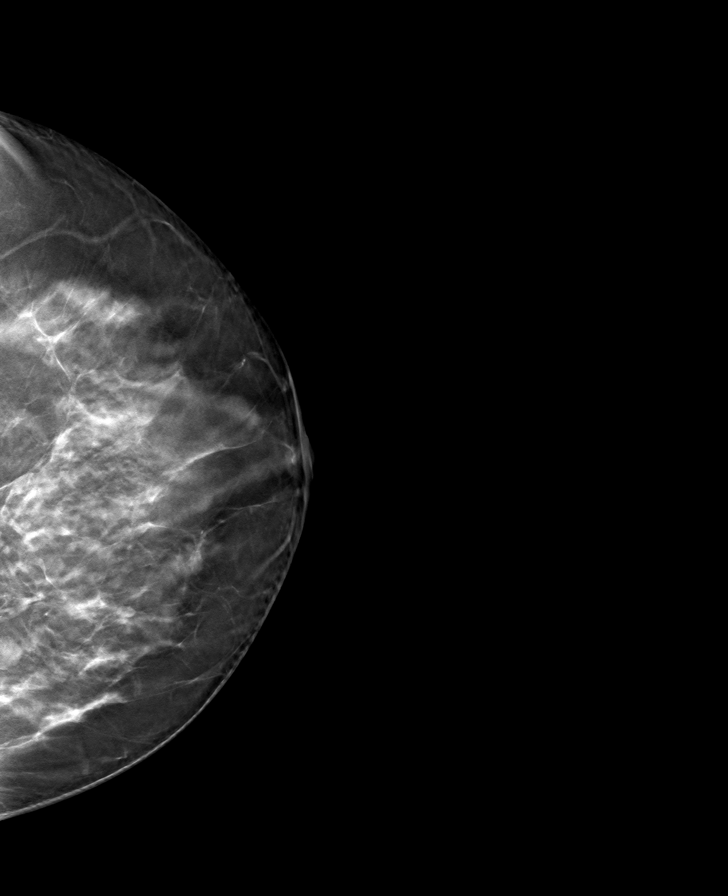

[R CC tomo · tomo slice 39/76.0]
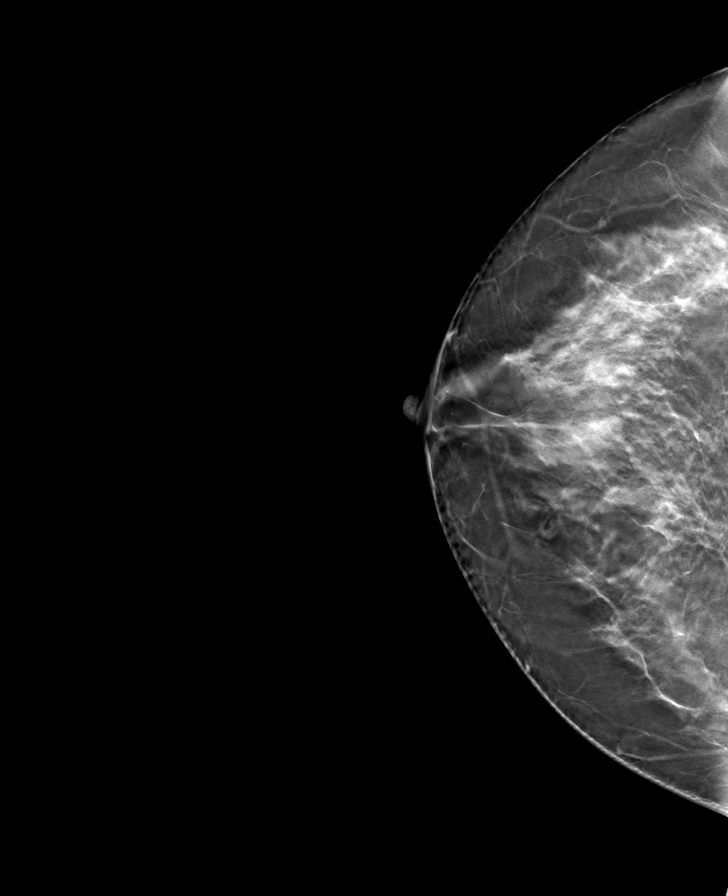

[L MLO tomo · tomo slice 44/87.0]
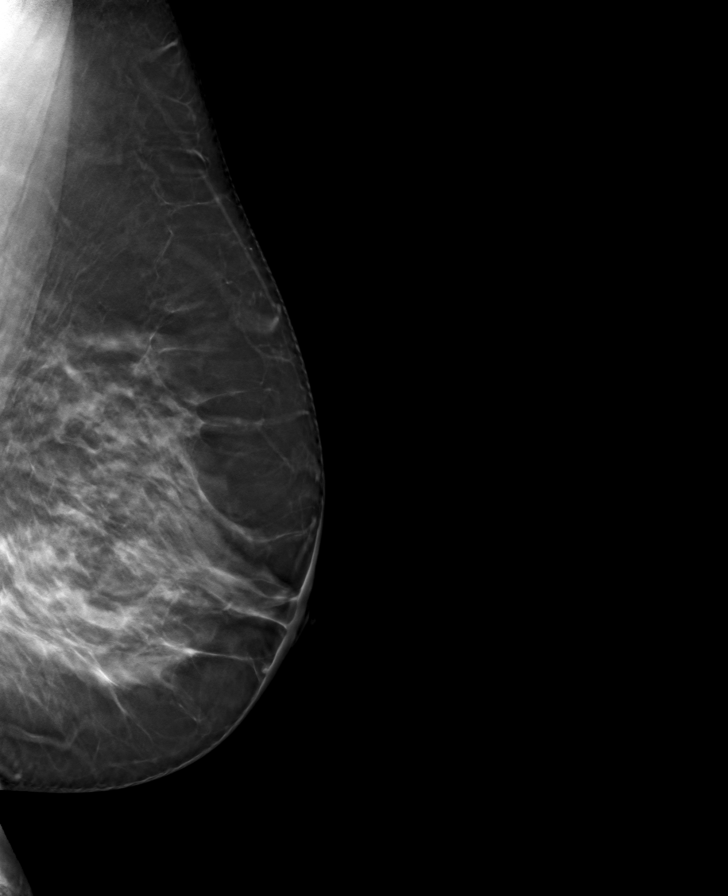

[R MLO tomo · tomo slice 43/84.0]
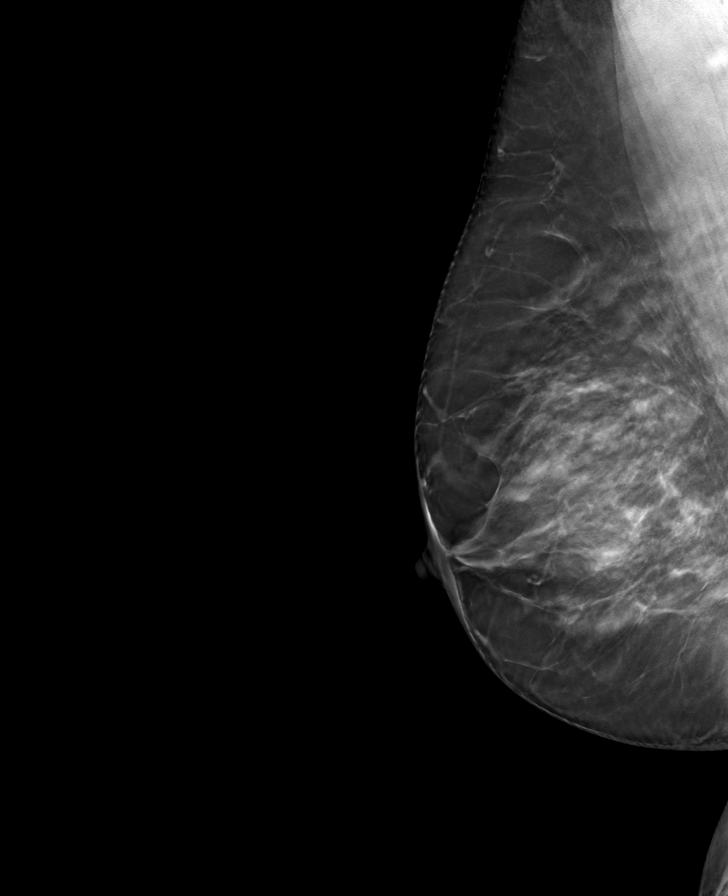

[8 of 24 positions shown; findings below may reference images not displayed]

ACR Breast Density Category c: The breast tissue is heterogeneously
dense, which may obscure small masses.
FINDINGS: There are no findings suspicious for malignancy. Images were
processed with CAD.
IMPRESSION: No mammographic evidence of malignancy. A result letter of this
screening mammogram will be mailed directly to the patient.

RECOMMENDATION:
Screening mammogram in one year. (Code:FT-U-LHB)

BI-RADS CATEGORY  1: Negative.

## 2022-04-29 DIAGNOSIS — J209 Acute bronchitis, unspecified: Secondary | ICD-10-CM | POA: Diagnosis not present

## 2022-05-03 DIAGNOSIS — R051 Acute cough: Secondary | ICD-10-CM | POA: Diagnosis not present

## 2022-05-16 DIAGNOSIS — R051 Acute cough: Secondary | ICD-10-CM | POA: Diagnosis not present

## 2022-05-16 DIAGNOSIS — M542 Cervicalgia: Secondary | ICD-10-CM | POA: Diagnosis not present

## 2022-05-23 DIAGNOSIS — R053 Chronic cough: Secondary | ICD-10-CM | POA: Insufficient documentation

## 2022-05-23 DIAGNOSIS — R0982 Postnasal drip: Secondary | ICD-10-CM | POA: Diagnosis not present

## 2022-05-23 DIAGNOSIS — R490 Dysphonia: Secondary | ICD-10-CM | POA: Diagnosis not present

## 2022-05-29 DIAGNOSIS — M542 Cervicalgia: Secondary | ICD-10-CM | POA: Diagnosis not present

## 2022-08-22 ENCOUNTER — Other Ambulatory Visit: Payer: Self-pay | Admitting: *Deleted

## 2022-08-22 DIAGNOSIS — Z3041 Encounter for surveillance of contraceptive pills: Secondary | ICD-10-CM

## 2022-08-22 MED ORDER — NORGESTIM-ETH ESTRAD TRIPHASIC 0.18/0.215/0.25 MG-35 MCG PO TABS: 1.0000 | ORAL_TABLET | Freq: Every day | ORAL | 0 refills | Status: DC

## 2022-08-22 NOTE — Telephone Encounter (Signed)
Left message on patient voicemail Rx sent. ?

## 2022-08-22 NOTE — Telephone Encounter (Signed)
Patient annual exam was rescheduled from 08/26/22 to 10/03/22. Patient called will need refill on BCP.

## 2022-08-26 ENCOUNTER — Ambulatory Visit: Payer: BC Managed Care – PPO | Admitting: Obstetrics and Gynecology

## 2022-10-01 NOTE — Progress Notes (Signed)
51 y.o. G70P0020 Single Caucasian female here for annual exam.    No period concerns.   Wants routine labs.  Still with some urinary incontinence.  Declines assistance.   PCP:   None.  Patient's last menstrual period was 09/26/2022.     Period Cycle (Days): 28 Period Duration (Days): 5 Period Pattern: Regular Menstrual Control: Maxi pad Dysmenorrhea: (!) Mild Dysmenorrhea Symptoms: Cramping     Sexually active: Yes.    The current method of family planning is birth control pills.    Exercising: No.   Smoker:  no  Health Maintenance: Pap 08/23/2021 neg,hpv neg  08/18/20 Negative: HR HPV negative, 03-07-17 ASCUS:Neg HR HPV  History of abnormal Pap:  yes, Hx Cryotherapy to cervix  MMG:  09/22/20, Breast Density category C, BI-RADS CATEGORY 1 Negative Colonoscopy:  n/a BMD:   n/a  Result  n/a TDaP:  2019 Gardasil:   no HIV: neg in past Hep C: neg in past Screening Labs:  today. Flu vaccine and Covid vaccines discussed.  She declines flu vaccine.   reports that she has never smoked. She has never used smokeless tobacco. She reports that she does not drink alcohol and does not use drugs.  Past Medical History:  Diagnosis Date   Abnormal Pap smear    CIN I (cervical intraepithelial neoplasia I) 08/31/2020   dysplasia in a large cervical polyp that was removed   Elevated hemoglobin A1c 2021   Psoriasis     Past Surgical History:  Procedure Laterality Date   BIOPSY  09/10/2019   Procedure: BIOPSY;  Surgeon: Jeani Hawking, MD;  Location: [facility];  Service: Endoscopy;;   CRYOTHERAPY     ESOPHAGOGASTRODUODENOSCOPY (EGD) WITH PROPOFOL N/A 09/10/2019   Procedure: ESOPHAGOGASTRODUODENOSCOPY (EGD) WITH PROPOFOL;  Surgeon: Jeani Hawking, MD;  Location: [facility];  Service: Endoscopy;  Laterality: N/A;   KNEE SURGERY Left    LEEP  2021   LGSIL in large cervical polyp   SAVORY DILATION N/A 09/10/2019   Procedure: SAVORY DILATION;  Surgeon: Jeani Hawking, MD;  Location: [facility];  Service: Endoscopy;  Laterality: N/A;    Current Outpatient Medications  Medication Sig Dispense Refill   Norgestimate-Ethinyl Estradiol Triphasic (TRI-ESTARYLLA) 0.18/0.215/0.25 MG-35 MCG tablet Take 1 tablet by mouth daily. 84 tablet 0   No current facility-administered medications for this visit.    History reviewed. No pertinent family history.  Review of Systems  All other systems reviewed and are negative.   Exam:   BP 122/80 (BP Location: Right Arm, Patient Position: Sitting, Cuff Size: Large)   Pulse 80   Ht 5\' 7"  (1.702 m)   Wt 298 lb (135.2 kg)   LMP 09/26/2022   BMI 46.67 kg/m     General appearance: alert, cooperative and appears stated age Head: normocephalic, without obvious abnormality, atraumatic Neck: no adenopathy, supple, symmetrical, trachea midline and thyroid normal to inspection and palpation Lungs: clear to auscultation bilaterally Breasts: normal appearance, no masses or tenderness, No nipple retraction or dimpling, No nipple discharge or bleeding, No axillary adenopathy Heart: regular rate and rhythm Abdomen: soft, non-tender; no masses, no organomegaly Extremities: extremities normal, atraumatic, no cyanosis or edema Skin: skin color, texture, turgor normal.  Patches of psoriasis.  Lymph nodes: cervical, supraclavicular, and axillary nodes normal. Neurologic: grossly normal  Pelvic: External genitalia:  no lesions              No abnormal inguinal nodes palpated.  Urethra:  normal appearing urethra with no masses, tenderness or lesions              Bartholins and Skenes: normal                 Vagina: normal appearing vagina with normal color and discharge, no lesions              Cervix: no lesions              Pap taken: yes Bimanual Exam:  Uterus:  normal size, contour, position, consistency, mobility, non-tender              Adnexa: no mass, fullness, tenderness              Rectal exam: yes.  Confirms.               Anus:  normal sphincter tone, no lesions  Chaperone was present for exam:  Kimalexis  Assessment:   Well woman visit with gynecologic exam. LGSIL of large cervical polyp 08/31/20. On COCs. Hx low vit D.  Hx elevated A1C.  Psoriasis.  GSI, mild. Colon cancer screening.   Plan: Mammogram screening discussed.  She will schedule. Self breast awareness reviewed. Pap and HR HPV collected.  Routine labs. Guidelines for Calcium, Vitamin D, regular exercise program including cardiovascular and weight bearing exercise. Refill of COCs for one year.  Cologuard ordered.  Patient declines colonoscopy.  Follow up annually and prn.   After visit summary provided.

## 2022-10-03 ENCOUNTER — Encounter: Payer: Self-pay | Admitting: Obstetrics and Gynecology

## 2022-10-03 ENCOUNTER — Ambulatory Visit (INDEPENDENT_AMBULATORY_CARE_PROVIDER_SITE_OTHER): Payer: BC Managed Care – PPO | Admitting: Obstetrics and Gynecology

## 2022-10-03 ENCOUNTER — Other Ambulatory Visit (HOSPITAL_COMMUNITY)
Admission: RE | Admit: 2022-10-03 | Discharge: 2022-10-03 | Disposition: A | Discharge status: Home / Self Care | Payer: BC Managed Care – PPO | Source: Ambulatory Visit | Attending: Obstetrics and Gynecology | Admitting: Obstetrics and Gynecology

## 2022-10-03 VITALS — BP 122/80 | HR 80 | Ht 67.0 in | Wt 298.0 lb

## 2022-10-03 DIAGNOSIS — Z87410 Personal history of cervical dysplasia: Secondary | ICD-10-CM | POA: Diagnosis not present

## 2022-10-03 DIAGNOSIS — R739 Hyperglycemia, unspecified: Secondary | ICD-10-CM | POA: Diagnosis not present

## 2022-10-03 DIAGNOSIS — Z3041 Encounter for surveillance of contraceptive pills: Secondary | ICD-10-CM

## 2022-10-03 DIAGNOSIS — Z01419 Encounter for gynecological examination (general) (routine) without abnormal findings: Secondary | ICD-10-CM

## 2022-10-03 DIAGNOSIS — Z1211 Encounter for screening for malignant neoplasm of colon: Secondary | ICD-10-CM

## 2022-10-03 DIAGNOSIS — Z124 Encounter for screening for malignant neoplasm of cervix: Secondary | ICD-10-CM | POA: Insufficient documentation

## 2022-10-03 DIAGNOSIS — Z0000 Encounter for general adult medical examination without abnormal findings: Secondary | ICD-10-CM

## 2022-10-03 DIAGNOSIS — E559 Vitamin D deficiency, unspecified: Secondary | ICD-10-CM | POA: Diagnosis not present

## 2022-10-03 MED ORDER — NORGESTIM-ETH ESTRAD TRIPHASIC 0.18/0.215/0.25 MG-35 MCG PO TABS: 1.0000 | ORAL_TABLET | Freq: Every day | ORAL | 3 refills | Status: DC

## 2022-10-03 NOTE — Patient Instructions (Signed)

## 2022-10-04 LAB — COMPREHENSIVE METABOLIC PANEL
AG Ratio: 1.3 (calc) (ref 1.0–2.5)
ALT: 10 U/L (ref 6–29)
AST: 12 U/L (ref 10–35)
Albumin: 4 g/dL (ref 3.6–5.1)
Alkaline phosphatase (APISO): 119 U/L (ref 37–153)
BUN: 10 mg/dL (ref 7–25)
CO2: 23 mmol/L (ref 20–32)
Calcium: 9.2 mg/dL (ref 8.6–10.4)
Chloride: 106 mmol/L (ref 98–110)
Creat: 0.7 mg/dL (ref 0.50–1.03)
Globulin: 3 g/dL (calc) (ref 1.9–3.7)
Glucose, Bld: 123 mg/dL — ABNORMAL HIGH (ref 65–99)
Potassium: 4.2 mmol/L (ref 3.5–5.3)
Sodium: 139 mmol/L (ref 135–146)
Total Bilirubin: 0.3 mg/dL (ref 0.2–1.2)
Total Protein: 7 g/dL (ref 6.1–8.1)

## 2022-10-04 LAB — CBC
HCT: 37.5 % (ref 35.0–45.0)
Hemoglobin: 12.5 g/dL (ref 11.7–15.5)
MCH: 27.2 pg (ref 27.0–33.0)
MCHC: 33.3 g/dL (ref 32.0–36.0)
MCV: 81.7 fL (ref 80.0–100.0)
MPV: 11.9 fL (ref 7.5–12.5)
Platelets: 381 10*3/uL (ref 140–400)
RBC: 4.59 10*6/uL (ref 3.80–5.10)
RDW: 13 % (ref 11.0–15.0)
WBC: 10.7 10*3/uL (ref 3.8–10.8)

## 2022-10-04 LAB — LIPID PANEL
Cholesterol: 169 mg/dL (ref <200)
HDL: 81 mg/dL (ref >=50)
LDL Cholesterol (Calc): 70 mg/dL (calc)
Non-HDL Cholesterol (Calc): 88 mg/dL (calc) (ref <130)
Total CHOL/HDL Ratio: 2.1 (calc) (ref <5.0)
Triglycerides: 97 mg/dL (ref <150)

## 2022-10-04 LAB — TSH: TSH: 2.04 mIU/L

## 2022-10-04 LAB — VITAMIN D 25 HYDROXY (VIT D DEFICIENCY, FRACTURES): Vit D, 25-Hydroxy: 20 ng/mL — ABNORMAL LOW (ref 30–100)

## 2022-10-07 LAB — CYTOLOGY - PAP
Adequacy: ABSENT
Comment: NEGATIVE
Diagnosis: NEGATIVE
High risk HPV: NEGATIVE

## 2022-10-07 NOTE — Addendum Note (Signed)
Addended by: Ardell Isaacs, Masaki Rothbauer E on: 10/07/2022 11:23 AM   Modules accepted: Orders

## 2022-12-08 ENCOUNTER — Telehealth: Payer: Self-pay | Admitting: Obstetrics and Gynecology

## 2022-12-08 NOTE — Telephone Encounter (Signed)
Please contact patient in follow up to her elevated glucose test results.   She has not returned for a hemoglobin A1C.   It looks like her blood sugar has been elevated intermittently over the last couple of years.   She may have prediabetes or diabetes.   I recommend she establish care with a PCP for follow up of this.

## 2022-12-09 NOTE — Telephone Encounter (Signed)
Encounter reviewed and closed.  

## 2022-12-09 NOTE — Telephone Encounter (Signed)
I spoke with patient and advised her of Dr. Elza Rafter message (read it to her).  She agrees she will find her a PCP to follow her glucose levels.

## 2023-01-08 ENCOUNTER — Telehealth: Payer: Self-pay | Admitting: Obstetrics and Gynecology

## 2023-01-08 NOTE — Telephone Encounter (Signed)
Left detailed message in patient's voice mail.

## 2023-01-08 NOTE — Telephone Encounter (Signed)
Please contact the patient regarding her Cologuard test.  I received a reminder in Epic that it was not complete yet.   If she declines to do the test, I can cancel the order for her.

## 2023-01-16 NOTE — Telephone Encounter (Signed)
My Chart message sent to patient inquiring.

## 2023-01-26 NOTE — Telephone Encounter (Signed)
I am cancelling the Cologuard test.   I can reorder this for the patient in the future if she desires this.   I am closing this encounter.

## 2023-03-18 DIAGNOSIS — R062 Wheezing: Secondary | ICD-10-CM | POA: Diagnosis not present

## 2023-03-21 ENCOUNTER — Telehealth: Payer: Self-pay | Admitting: Family Medicine

## 2023-03-21 ENCOUNTER — Ambulatory Visit
Admission: RE | Admit: 2023-03-21 | Discharge: 2023-03-21 | Disposition: A | Discharge status: Home / Self Care | Payer: BC Managed Care – PPO | Source: Ambulatory Visit

## 2023-03-21 ENCOUNTER — Ambulatory Visit (INDEPENDENT_AMBULATORY_CARE_PROVIDER_SITE_OTHER): Payer: BC Managed Care – PPO

## 2023-03-21 ENCOUNTER — Telehealth: Payer: Self-pay

## 2023-03-21 VITALS — BP 186/100 | HR 90 | Temp 98.0°F | Resp 16

## 2023-03-21 DIAGNOSIS — R059 Cough, unspecified: Secondary | ICD-10-CM

## 2023-03-21 DIAGNOSIS — J40 Bronchitis, not specified as acute or chronic: Secondary | ICD-10-CM | POA: Diagnosis not present

## 2023-03-21 DIAGNOSIS — J309 Allergic rhinitis, unspecified: Secondary | ICD-10-CM | POA: Diagnosis not present

## 2023-03-21 MED ORDER — HYDROCOD POLI-CHLORPHE POLI ER 10-8 MG/5ML PO SUER: 5.0000 mL | Freq: Every evening | ORAL | 0 refills | Status: DC | PRN

## 2023-03-21 MED ORDER — METHYLPREDNISOLONE SODIUM SUCC 125 MG IJ SOLR
125.0000 mg | Freq: Once | INTRAMUSCULAR | Status: AC
  Administered 2023-03-21: 125 mg via INTRAMUSCULAR

## 2023-03-21 MED ORDER — DOXYCYCLINE HYCLATE 100 MG PO CAPS: 100.0000 mg | ORAL_CAPSULE | Freq: Two times a day (BID) | ORAL | 0 refills | Status: AC

## 2023-03-21 NOTE — Telephone Encounter (Signed)
Patient calls clinic to report Tussionex is not available at previous pharmacy requested [facility].  Patient reports testing next be sent to [facility] [place], [place].  Tussionex sent per patient request.

## 2023-03-21 NOTE — Discharge Instructions (Addendum)
Advised patient of chest x-ray results with hardcopy provided to patient.  Advised patient to take medication as directed with food to completion.  Advised patient may use Tussionex at night for cough due to sedate of effects of this medication.  Encouraged increase daily water intake to 64 ounces per day.  Patient advised of chest x-ray results with incidental finding of 1.6 cm nodular density in the lateral aspect of right lower lung field.  Advised follow-up with PCP and possible CT of chest with contrast to further characterize this nodule would be advised.  Advised patient may establish care with [facility] primary care at [facility] [address] [place], [place] [address] [phone].  Advised if symptoms worsen and/or unresolved please follow-up with PCP, pulmonology or here for further evaluation.

## 2023-03-21 NOTE — ED Triage Notes (Signed)
Pt presents to uc with co of cough for one week. Pt reports she was seen at an urgent care Tuesday and was given a breathing treatment and prednisone and she does not feel any better.

## 2023-03-21 NOTE — ED Provider Notes (Signed)
[facility]    CSN: [id number] Arrival date & time: 03/21/23  1304      History   Chief Complaint Chief Complaint  Patient presents with   Cough    Entered by patient    HPI Katherine Murphy is a 52 y.o. female.   HPI 52 year old female presents with nonproductive frequent cough for 1 week.  Additionally, patient reports postnasal drainage/drip.  PMH significant for obesity, psoriasis, and CIN-1.  Patient reports was treated at another urgent care on Tuesday of this week and giving 1 breathing treatment and 10-day prednisone pack which has not relieved her current symptoms.  Started 10-day prednisone pack on 03/18/2023.  Patient reports cough at night is disrupting sleep.  Past Medical History:  Diagnosis Date   Abnormal Pap smear    CIN I (cervical intraepithelial neoplasia I) 08/31/2020   dysplasia in a large cervical polyp that was removed   Elevated hemoglobin A1c 2021   Psoriasis     Patient Active Problem List   Diagnosis Date Noted   Allergic rhinitis 04/21/2014   Uncomplicated asthma 04/21/2014    Past Surgical History:  Procedure Laterality Date   BIOPSY  09/10/2019   Procedure: BIOPSY;  Surgeon: Jeani Hawking, MD;  Location: [facility];  Service: Endoscopy;;   CRYOTHERAPY     ESOPHAGOGASTRODUODENOSCOPY (EGD) WITH PROPOFOL N/A 09/10/2019   Procedure: ESOPHAGOGASTRODUODENOSCOPY (EGD) WITH PROPOFOL;  Surgeon: Jeani Hawking, MD;  Location: [facility];  Service: Endoscopy;  Laterality: N/A;   KNEE SURGERY Left    LEEP  2021   LGSIL in large cervical polyp   SAVORY DILATION N/A 09/10/2019   Procedure: SAVORY DILATION;  Surgeon: Jeani Hawking, MD;  Location: [facility];  Service: Endoscopy;  Laterality: N/A;    OB History     Gravida  2   Para  0   Term  0   Preterm      AB  2   Living  0      SAB      IAB  2   Ectopic      Multiple      Live Births               Home Medications    Prior to Admission medications    Medication Sig Start Date End Date Taking? Authorizing Provider  chlorpheniramine-HYDROcodone (TUSSIONEX) 10-8 MG/5ML Take 5 mLs by mouth at bedtime as needed for cough. 03/21/23  Yes Trevor Iha, FNP  doxycycline (VIBRAMYCIN) 100 MG capsule Take 1 capsule (100 mg total) by mouth 2 (two) times daily for 10 days. 03/21/23 03/31/23 Yes Trevor Iha, FNP  predniSONE (STERAPRED UNI-PAK 21 TAB) 10 MG (21) TBPK tablet 10 mg See admin instructions. follow package directions 03/18/23  Yes [provider]  Norgestimate-Ethinyl Estradiol Triphasic (TRI-ESTARYLLA) 0.18/0.215/0.25 MG-35 MCG tablet Take 1 tablet by mouth daily. 10/03/22   Patton Salles, MD    Family History History reviewed. No pertinent family history.  Social History Social History   Tobacco Use   Smoking status: Never   Smokeless tobacco: Never  Vaping Use   Vaping Use: Never used  Substance Use Topics   Alcohol use: No   Drug use: No     Allergies   Patient has no known allergies.   Review of Systems Review of Systems  Respiratory:  Positive for cough.   All other systems reviewed and are negative.    Physical Exam Triage Vital Signs ED Triage Vitals  Enc Vitals Group     BP 03/21/23 1420 (!) 186/100     Pulse Rate 03/21/23 1420 90     Resp 03/21/23 1420 16     Temp 03/21/23 1420 98 F (36.7 C)     Temp Source 03/21/23 1420 Oral     SpO2 03/21/23 1420 98 %     Weight --      Height --      Head Circumference --      Peak Flow --      Pain Score 03/21/23 1419 0     Pain Loc --      Pain Edu? --      Excl. in GC? --    No data found.  Updated Vital Signs BP (!) 186/100   Pulse 90   Temp 98 F (36.7 C) (Oral)   Resp 16   LMP 03/07/2023 (Exact Date)   SpO2 98%       Physical Exam Vitals and nursing note reviewed.  Constitutional:      Appearance: Normal appearance. She is obese. She is ill-appearing.  HENT:     Head: Normocephalic and atraumatic.     Right Ear:  Tympanic membrane, ear canal and external ear normal.     Left Ear: Tympanic membrane, ear canal and external ear normal.     Nose:     Comments: Turbinates are erythematous/edematous    Mouth/Throat:     Mouth: Mucous membranes are moist.     Pharynx: Oropharynx is clear.     Comments: Significant amount of clear drainage of posterior oropharynx noted Eyes:     Extraocular Movements: Extraocular movements intact.     Conjunctiva/sclera: Conjunctivae normal.     Pupils: Pupils are equal, round, and reactive to light.  Cardiovascular:     Rate and Rhythm: Normal rate and regular rhythm.     Pulses: Normal pulses.     Heart sounds: Normal heart sounds. No murmur heard.    No friction rub. No gallop.     Comments: Hypertensive Pulmonary:     Effort: Pulmonary effort is normal.     Breath sounds: Rhonchi present. No wheezing or rales.     Comments: Diffuse scattered rhonchi noted throughout with frequent nonproductive cough noted on exam Chest:     Chest wall: No tenderness.  Musculoskeletal:        General: Normal range of motion.     Cervical back: Normal range of motion and neck supple. No tenderness.  Lymphadenopathy:     Cervical: No cervical adenopathy.  Skin:    General: Skin is warm and dry.  Neurological:     General: No focal deficit present.     Mental Status: She is alert and oriented to person, place, and time. Mental status is at baseline.  Psychiatric:        Mood and Affect: Mood normal.        Behavior: Behavior normal.        Thought Content: Thought content normal.      UC Treatments / Results  Labs (all labs ordered are listed, but only abnormal results are displayed) Labs Reviewed - No data to display  EKG   Radiology DG Chest 2 View  Result Date: 03/21/2023 CLINICAL DATA:  Cough EXAM: CHEST - 2 VIEW COMPARISON:  None Available. FINDINGS: Transverse diameter of heart is in upper limits of normal. There are no signs of pulmonary edema or focal  pulmonary consolidation. Costophrenic angles are  clear. There is no pneumothorax. In the PA view, there is 1.6 cm nodular density in the lateral aspect of right lower lung field. IMPRESSION: There are no signs of pulmonary edema or focal pulmonary consolidation. There is 1.6 cm nodular density in the lateral aspect of right lower lung field which may suggest the nipple shadow or pleural plaque or a parenchymal nodule such as a granuloma or neoplasm. Repeat chest radiographs with nipple markers and CT if warranted may be considered. Electronically Signed   By: Ernie Avena M.D.   On: 03/21/2023 14:57    Procedures Procedures (including critical care time)  Medications Ordered in UC Medications  methylPREDNISolone sodium succinate (SOLU-MEDROL) 125 mg/2 mL injection 125 mg (has no administration in time range)    Initial Impression / Assessment and Plan / UC Course  I have reviewed the triage vital signs and the nursing notes.  Pertinent labs & imaging results that were available during my care of the patient were reviewed by me and considered in my medical decision making (see chart for details).     MDM: 1.  Cough, unspecified type-CXR revealed above.  Patient notified of 1.6 nodular density in the lateral aspect of right lower lung field.  Advised patient to follow-up with PCP for CT of chest with contrast to further characterize this nodular density.  Rx'd Doxycycline 100 mg capsule twice daily x 10 days, Rx'd Tussionex 10-8 mg / 5 mL: Take 5 mL nightly. 2.  Bronchitis-IM Solu-Medrol 125 mg given once in clinic prior to discharge.  Patient is currently on day 4 of 10 days Sterapred Unipak prescribed on 03/18/2023 with another urgent care.  Advised patient of chest x-ray results with hardcopy provided to patient.  Advised patient to take medication as directed with food to completion.  Advised patient may use Tussionex at night for cough due to sedate of effects of this medication.   Encouraged increase daily water intake to 64 ounces per day.  Patient advised of chest x-ray results with incidental finding of 1.6 cm nodular density in the lateral aspect of right lower lung field.  Advised follow-up with PCP and possible CT of chest with contrast to further characterize this nodule would be advised.  Advised patient may establish care with [facility] primary care at [facility] [address], [place], [place] [address] [phone].  Advised if symptoms worsen and/or unresolved please follow-up with PCP, pulmonology or here for further evaluation. Final Clinical Impressions(s) / UC Diagnoses   Final diagnoses:  Cough, unspecified type  Bronchitis  Allergic rhinitis, unspecified seasonality, unspecified trigger     Discharge Instructions      Advised patient of chest x-ray results with hardcopy provided to patient.  Advised patient to take medication as directed with food to completion.  Advised patient may use Tussionex at night for cough due to sedate of effects of this medication.  Encouraged increase daily water intake to 64 ounces per day.  Patient advised of chest x-ray results with incidental finding of 6 cm nodular density in the lateral aspect of right lower lung field.  Advised follow-up with PCP and possible CT of chest with contrast to further characterize this nodule would be advised.  Advised patient may establish care with [facility] primary care at [facility] [address], [place], [place] [address] [phone].  Advised if symptoms worsen and/or unresolved please follow-up with PCP, pulmonology or here for further evaluation.     ED Prescriptions  Medication Sig Dispense Auth. Provider   doxycycline (VIBRAMYCIN) 100 MG capsule Take 1 capsule (100 mg total) by mouth 2 (two) times daily for 10 days. 20 capsule Trevor Iha, FNP   chlorpheniramine-HYDROcodone (TUSSIONEX) 10-8 MG/5ML Take 5 mLs by mouth at bedtime as needed for cough. 115 mL  Trevor Iha, FNP      I have reviewed the PDMP during this encounter.   Trevor Iha, FNP 03/21/23 1533

## 2023-03-27 ENCOUNTER — Telehealth: Payer: Self-pay

## 2023-03-27 NOTE — Telephone Encounter (Signed)
T left VM on nurse line. Chart looked over with provider. Pt had stated in VM that she wasn't feeling much better. Provider advised to finished abx and prednisone and follow up with PCP if not better by next week.

## 2023-04-10 ENCOUNTER — Ambulatory Visit (INDEPENDENT_AMBULATORY_CARE_PROVIDER_SITE_OTHER): Payer: BC Managed Care – PPO

## 2023-04-10 ENCOUNTER — Encounter: Payer: Self-pay | Admitting: Family Medicine

## 2023-04-10 ENCOUNTER — Ambulatory Visit (INDEPENDENT_AMBULATORY_CARE_PROVIDER_SITE_OTHER): Payer: BC Managed Care – PPO | Admitting: Family Medicine

## 2023-04-10 VITALS — BP 164/91 | HR 98 | Temp 98.4°F | Resp 18 | Ht 67.5 in | Wt 286.6 lb

## 2023-04-10 DIAGNOSIS — J309 Allergic rhinitis, unspecified: Secondary | ICD-10-CM | POA: Diagnosis not present

## 2023-04-10 DIAGNOSIS — Z7689 Persons encountering health services in other specified circumstances: Secondary | ICD-10-CM

## 2023-04-10 DIAGNOSIS — R911 Solitary pulmonary nodule: Secondary | ICD-10-CM | POA: Diagnosis not present

## 2023-04-10 DIAGNOSIS — R053 Chronic cough: Secondary | ICD-10-CM

## 2023-04-10 DIAGNOSIS — K219 Gastro-esophageal reflux disease without esophagitis: Secondary | ICD-10-CM

## 2023-04-10 MED ORDER — ALBUTEROL SULFATE HFA 108 (90 BASE) MCG/ACT IN AERS
2.0000 | INHALATION_SPRAY | Freq: Four times a day (QID) | RESPIRATORY_TRACT | 0 refills | Status: DC | PRN
Start: 2023-04-10 — End: 2024-01-20

## 2023-04-10 MED ORDER — BUDESONIDE-FORMOTEROL FUMARATE 160-4.5 MCG/ACT IN AERO
2.0000 | INHALATION_SPRAY | Freq: Two times a day (BID) | RESPIRATORY_TRACT | 12 refills | Status: DC
Start: 2023-04-10 — End: 2024-03-26

## 2023-04-10 MED ORDER — PANTOPRAZOLE SODIUM 40 MG PO TBEC: 40.0000 mg | DELAYED_RELEASE_TABLET | Freq: Every day | ORAL | 1 refills | Status: DC

## 2023-04-10 MED ORDER — MONTELUKAST SODIUM 10 MG PO TABS
10.0000 mg | ORAL_TABLET | Freq: Every day | ORAL | 3 refills | Status: DC
Start: 2023-04-10 — End: 2023-07-08

## 2023-04-10 MED ORDER — IOHEXOL 300 MG/ML  SOLN
100.0000 mL | Freq: Once | INTRAMUSCULAR | Status: AC | PRN
Start: 2023-04-10 — End: 2023-04-10
  Administered 2023-04-10: 100 mL via INTRAVENOUS

## 2023-04-10 NOTE — Progress Notes (Signed)
New Patient Office Visit  Subjective    Patient ID: Katherine Murphy, female    DOB: 1970/12/13  Age: 52 y.o. MRN: [id number]  CC:  Chief Complaint  Patient presents with   Establish Care    Patient is here to establish care with new provider, she states that she is here  with concerns of a prolonged cough that she has had for about 5- 6 weeks. Patient states that she has tried OTC cough syrups but nothing works.    HPI Katherine Murphy presents to establish care. Pt is new to me  Pt reports a cough since high school. She says she gets this every year. Per chart review, pt has seen Pulmonologist in 2015. Was prescribed Albuterol, Dulera, and Singulair. Pt reports she tried this and it didn't help. She then came back for PFT's which she was told it was fine. She then seen GI in 2020 for evaluation of swallowing issues since high school. 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 She had EGD done and esophageal dilation. She then has seen ENT in 2023 and was suspected to have reflux. She was given Protonix 40mg  which didn't help.  Pt reports she never had improvement with any treatments done by Pulmonology in 2015, GI in 2020, or ENT in 2023.  She reports never smoking. No pets. She does have wheezing. She went to urgent care on 5/17. Had xray done that showed 1.6 cm nodular density on the right. Recommended CT chest.  She was given Doxycycline, prednisone, and Tussionex. She reports improvement of cough with prednisone. She hasn't taken her PPI in a long time.    Outpatient Encounter Medications as of 04/10/2023  Medication Sig   Norgestimate-Ethinyl Estradiol Triphasic (TRI-ESTARYLLA) 0.18/0.215/0.25 MG-35 MCG tablet Take 1 tablet by mouth daily.   pantoprazole (PROTONIX) 40 MG tablet Take 1 tablet by mouth daily.   chlorpheniramine-HYDROcodone (TUSSIONEX) 10-8 MG/5ML Take 5 mLs by mouth at bedtime as needed for cough.   [DISCONTINUED] chlorpheniramine-HYDROcodone  (TUSSIONEX) 10-8 MG/5ML Take 5 mLs by mouth at bedtime as needed for cough.   [DISCONTINUED] predniSONE (STERAPRED UNI-PAK 21 TAB) 10 MG (21) TBPK tablet 10 mg See admin instructions. follow package directions   No facility-administered encounter medications on file as of 04/10/2023.    Past Medical History:  Diagnosis Date   Abnormal Pap smear    CIN I (cervical intraepithelial neoplasia I) 08/31/2020   dysplasia in a large cervical polyp that was removed   Elevated hemoglobin A1c 2021   Psoriasis     Past Surgical History:  Procedure Laterality Date   BIOPSY  09/10/2019   Procedure: BIOPSY;  Surgeon: Jeani Hawking, MD;  Location: [facility];  Service: Endoscopy;;   CRYOTHERAPY     ESOPHAGOGASTRODUODENOSCOPY (EGD) WITH PROPOFOL N/A 09/10/2019   Procedure: ESOPHAGOGASTRODUODENOSCOPY (EGD) WITH PROPOFOL;  Surgeon: Jeani Hawking, MD;  Location: [facility];  Service: Endoscopy;  Laterality: N/A;   KNEE SURGERY Left    LEEP  2021   LGSIL in large cervical polyp   SAVORY DILATION N/A 09/10/2019   Procedure: SAVORY DILATION;  Surgeon: Jeani Hawking, MD;  Location: [facility];  Service: Endoscopy;  Laterality: N/A;    Family History  Problem Relation Age of Onset   Healthy Mother    Healthy Father     Social History   Socioeconomic History   Marital status: Single    Spouse name: Not on file   Number of children: Not on file  Years of education: Not on file   Highest education level: Not on file  Occupational History   Not on file  Tobacco Use   Smoking status: Never    Passive exposure: Never   Smokeless tobacco: Never  Vaping Use   Vaping Use: Never used  Substance and Sexual Activity   Alcohol use: No   Drug use: No   Sexual activity: Yes    Partners: Male    Birth control/protection: Pill  Other Topics Concern   Not on file  Social History Narrative   Not on file   Social Determinants of Health   Financial Resource Strain: Not on file  Food Insecurity:  Not on file  Transportation Needs: Not on file  Physical Activity: Not on file  Stress: Not on file  Social Connections: Not on file  Intimate Partner Violence: Not on file    Review of Systems  Respiratory:  Positive for cough, sputum production and wheezing. Negative for hemoptysis and shortness of breath.   Gastrointestinal:        Reflux  All other systems reviewed and are negative.      Objective    BP (!) 164/91   Pulse 98   Temp 98.4 F (36.9 C) (Oral)   Resp 18   Ht 5' 7.5" (1.715 m)   Wt 286 lb 9.6 oz (130 kg)   LMP 03/07/2023 (Exact Date)   SpO2 94%   BMI 44.23 kg/m   Physical Exam Vitals and nursing note reviewed.  Constitutional:      Appearance: Normal appearance. She is obese.  HENT:     Head: Normocephalic and atraumatic.     Right Ear: Tympanic membrane, ear canal and external ear normal.     Left Ear: Tympanic membrane, ear canal and external ear normal.     Nose: Nose normal.     Mouth/Throat:     Mouth: Mucous membranes are moist.     Pharynx: Oropharynx is clear.  Eyes:     Conjunctiva/sclera: Conjunctivae normal.     Pupils: Pupils are equal, round, and reactive to light.  Cardiovascular:     Rate and Rhythm: Normal rate and regular rhythm.     Pulses: Normal pulses.     Heart sounds: Normal heart sounds.  Pulmonary:     Effort: Pulmonary effort is normal.     Breath sounds: Normal breath sounds.  Abdominal:     General: Abdomen is flat. Bowel sounds are normal.  Skin:    General: Skin is warm.     Capillary Refill: Capillary refill takes less than 2 seconds.  Neurological:     General: No focal deficit present.     Mental Status: She is alert and oriented to person, place, and time. Mental status is at baseline.  Psychiatric:        Mood and Affect: Mood normal.        Behavior: Behavior normal.        Thought Content: Thought content normal.        Judgment: Judgment normal.       Assessment & Plan:   Problem List Items  Addressed This Visit   None  Encounter to establish care with new doctor  Chronic cough -     Montelukast Sodium; Take 1 tablet (10 mg total) by mouth at bedtime.  Dispense: 30 tablet; Refill: 3 -     Albuterol Sulfate HFA; Inhale 2 puffs into the lungs every 6 (six) hours as  needed for wheezing or shortness of breath.  Dispense: 8 g; Refill: 0 -     Budesonide-Formoterol Fumarate; Inhale 2 puffs into the lungs 2 (two) times daily.  Dispense: 1 each; Refill: 12  Gastroesophageal reflux disease without esophagitis -     Pantoprazole Sodium; Take 1 tablet (40 mg total) by mouth daily.  Dispense: 30 tablet; Refill: 1  Allergic rhinitis, unspecified seasonality, unspecified trigger -     Montelukast Sodium; Take 1 tablet (10 mg total) by mouth at bedtime.  Dispense: 30 tablet; Refill: 3 -     Albuterol Sulfate HFA; Inhale 2 puffs into the lungs every 6 (six) hours as needed for wheezing or shortness of breath.  Dispense: 8 g; Refill: 0 -     Budesonide-Formoterol Fumarate; Inhale 2 puffs into the lungs 2 (two) times daily.  Dispense: 1 each; Refill: 12  Lesion of right lung -     CT CHEST W CONTRAST; Future  Reviewed previous work up from ENT, Pulmonology and GI. 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 She was also seen by pulmonology with treatment for asthma with albuterol, dulera, and singulair that pt only done for 1 month. She had allergy testing, unknown results. Pt seen ENT last year and given Protonix 40mg  for reflux.  Pt hasn't been on treatment of GERD and allergic asthma together. Suspect she has more than 1 condition going on, causing her chronic cough. She went to ER last week with CXR revealing a nodular lung lesion. Will set up a CT Scan of chest to evaluate further. For now, treat allergic and GERD component with Singulair 10mg  at night, Albuterol inhaler 4-6 times a day prn, Symbicort 2 puffs  inhaled BID along with Protonix 40mg  30 mins before she eats. See back 4 weeks for follow up.  Pt is in agreement with assessment and plan.  No follow-ups on file.   Suzan Slick, MD  Total time spent with patient today 46 minutes. This includes reviewing records, evaluating the patient and coordinating care. Face-to-face time >50%.

## 2023-04-11 ENCOUNTER — Encounter: Payer: Self-pay | Admitting: Family Medicine

## 2023-05-07 ENCOUNTER — Encounter: Payer: Self-pay | Admitting: Family Medicine

## 2023-05-07 ENCOUNTER — Ambulatory Visit (INDEPENDENT_AMBULATORY_CARE_PROVIDER_SITE_OTHER): Payer: BC Managed Care – PPO | Admitting: Family Medicine

## 2023-05-07 VITALS — BP 163/92 | HR 77 | Temp 98.6°F | Resp 18 | Ht 67.0 in | Wt 276.0 lb

## 2023-05-07 DIAGNOSIS — R41840 Attention and concentration deficit: Secondary | ICD-10-CM | POA: Diagnosis not present

## 2023-05-07 DIAGNOSIS — R053 Chronic cough: Secondary | ICD-10-CM | POA: Diagnosis not present

## 2023-05-07 DIAGNOSIS — I10 Essential (primary) hypertension: Secondary | ICD-10-CM

## 2023-05-07 MED ORDER — ATOMOXETINE HCL 25 MG PO CAPS
25.0000 mg | ORAL_CAPSULE | Freq: Every day | ORAL | 0 refills | Status: DC
Start: 2023-05-07 — End: 2023-05-29

## 2023-05-07 MED ORDER — AMLODIPINE BESYLATE 10 MG PO TABS
10.0000 mg | ORAL_TABLET | Freq: Every day | ORAL | 0 refills | Status: AC
Start: 2023-05-07 — End: ?

## 2023-05-07 NOTE — Progress Notes (Signed)
   Established Patient Office Visit  Subjective   Patient ID: Katherine Murphy, female    DOB: 31-Aug-1971  Age: 52 y.o. MRN: [id number]  Chief Complaint  Patient presents with   Follow-up    Residual cough that wont go away    HPI  Cough Pt was seen 4 weeks ago for continued cough for years. She has hx of allergies and GERD. She was given treatment with Singulair 10 mg at bedtime and Protonix 40 mg daily. She also had Symbicort bid. She has stopped this on June 17th because the cough resolved.   Inattention Pt reports issues with focusing. She does report a lot of tasks and hard to complete tasks. She would like to try something to help.  Hypertension Pt noted to have elevated blood pressures x 3 and today. She has never had any antihypertensive treatment. Declines headaches or chest pain. Last CPE in November 2023 through her OB.  Patient Active Problem List   Diagnosis Date Noted   Gastroesophageal reflux disease 04/10/2023   Neck pain 05/29/2022   Chronic cough 05/23/2022   Psoriasis 12/04/2020   Allergic rhinitis 04/21/2014     Review of Systems  Psychiatric/Behavioral:         Inattention  All other systems reviewed and are negative.    Objective:     There were no vitals taken for this visit.   Physical Exam Vitals and nursing note reviewed.  Constitutional:      Appearance: She is obese.  HENT:     Head: Normocephalic and atraumatic.     Right Ear: External ear normal.     Left Ear: External ear normal.     Nose: Nose normal.  Cardiovascular:     Rate and Rhythm: Normal rate and regular rhythm.     Pulses: Normal pulses.     Heart sounds: Normal heart sounds.  Pulmonary:     Effort: Pulmonary effort is normal.     Breath sounds: Normal breath sounds.  Skin:    Capillary Refill: Capillary refill takes less than 2 seconds.  Neurological:     General: No focal deficit present.     Mental Status: She is alert and oriented to person, place, and  time. Mental status is at baseline.  Psychiatric:        Mood and Affect: Mood normal.        Behavior: Behavior normal.        Thought Content: Thought content normal.        Judgment: Judgment normal.    No results found for any visits on 05/07/23.    The 10-year ASCVD risk score (Arnett DK, et al., 2019) is: 1.3%    Assessment & Plan:   Problem List Items Addressed This Visit   None Chronic cough  Inattention -     Atomoxetine HCl; Take 1 capsule (25 mg total) by mouth daily.  Dispense: 30 capsule; Refill: 0  Primary hypertension -     amLODIPine Besylate; Take 1 tablet (10 mg total) by mouth daily.  Dispense: 90 tablet; Refill: 0   Cough is much better with GERD and allergic rhinitis treatment. Continue Singulair 10mg  nightly and Pantoprazole 20mg  daily Trial of Strattera 25mg  for inattention Start Amlodipine 10mg  daily for HTN.  See back in 6 weeks for follow up of inattention and HTN.   No follow-ups on file.    Suzan Slick, MD

## 2023-05-29 ENCOUNTER — Other Ambulatory Visit: Payer: Self-pay | Admitting: Family Medicine

## 2023-05-29 DIAGNOSIS — R41840 Attention and concentration deficit: Secondary | ICD-10-CM

## 2023-06-18 ENCOUNTER — Ambulatory Visit (INDEPENDENT_AMBULATORY_CARE_PROVIDER_SITE_OTHER): Payer: BC Managed Care – PPO | Admitting: Family Medicine

## 2023-06-18 VITALS — BP 157/83 | HR 100 | Temp 97.7°F | Resp 18 | Ht 67.0 in | Wt 277.0 lb

## 2023-06-18 DIAGNOSIS — R41840 Attention and concentration deficit: Secondary | ICD-10-CM | POA: Diagnosis not present

## 2023-06-18 DIAGNOSIS — R053 Chronic cough: Secondary | ICD-10-CM | POA: Diagnosis not present

## 2023-06-18 DIAGNOSIS — I10 Essential (primary) hypertension: Secondary | ICD-10-CM

## 2023-06-18 MED ORDER — BUPROPION HCL ER (XL) 150 MG PO TB24
150.0000 mg | ORAL_TABLET | ORAL | 0 refills | Status: DC
Start: 2023-06-18 — End: 2023-07-30

## 2023-06-18 NOTE — Progress Notes (Signed)
Established Patient Office Visit  Subjective   Patient ID: Katherine Murphy, female    DOB: Dec 10, 1970  Age: 52 y.o. MRN: [id number]  Chief Complaint  Patient presents with   Medical Management of Chronic Issues    Patient is here for a 6 week follow up for HTN    HPI  Hypertension Pt was started on Amlodipine 10 mg 6 weeks ago. She is here for follow up. She takes this at night because she was also added on Strattera 25 mg for attention. She says both together made her feel drowsy. She still has some drowsiness in the day when taking the Strattera 25 mg.  She has wrist blood pressure cuff but not checking at home. Denies chest pains.  Inattention Pt started on Strattera 25 mg daily for attention. States it made her feel drowsy and more moody. She would like to try a different medicine.  Cough Pt with chronic cough presumed to be from allergic rhinitis and GERD. She was well controlled when started on Protonix 40mg  daily and Singulair at night along with Albuterol inhaler prn/Symbicort 2 puffs BID. Last visit she reported she stopped the Symbicort. She is here today with worsening cough. She says it started last weekend. She also reports she was exposed to someone with covid. Denies fevers.    Review of Systems  Respiratory:  Positive for cough.   Psychiatric/Behavioral:         Attention deficit  All other systems reviewed and are negative.     Objective:     BP (!) 157/83   Pulse 100   Temp 97.7 F (36.5 C) (Oral)   Resp 18   Ht 5\' 7"  (1.702 m)   Wt 277 lb (125.6 kg)   SpO2 97%   BMI 43.38 kg/m  BP Readings from Last 3 Encounters:  06/18/23 (!) 157/83  05/07/23 (!) 163/92  04/10/23 (!) 164/91      Physical Exam Vitals and nursing note reviewed.  Constitutional:      Appearance: She is obese.  HENT:     Head: Normocephalic and atraumatic.     Right Ear: External ear normal.     Left Ear: External ear normal.     Nose: Nose normal.     Mouth/Throat:      Mouth: Mucous membranes are moist.  Cardiovascular:     Rate and Rhythm: Normal rate and regular rhythm.     Pulses: Normal pulses.     Heart sounds: Normal heart sounds.  Pulmonary:     Effort: Pulmonary effort is normal.     Breath sounds: Normal breath sounds.  Abdominal:     General: Abdomen is flat.  Skin:    General: Skin is warm.     Capillary Refill: Capillary refill takes less than 2 seconds.  Neurological:     General: No focal deficit present.     Mental Status: She is alert and oriented to person, place, and time. Mental status is at baseline.  Psychiatric:        Mood and Affect: Mood normal.        Behavior: Behavior normal.        Thought Content: Thought content normal.        Judgment: Judgment normal.     No results found for any visits on 06/18/23.     The 10-year ASCVD risk score (Arnett DK, et al., 2019) is: 1.6%    Assessment & Plan:   Problem  List Items Addressed This Visit       Other   Chronic cough   Other Visit Diagnoses     Primary hypertension    -  Primary   Inattention       Relevant Medications   buPROPion (WELLBUTRIN XL) 150 MG 24 hr tablet     Primary hypertension  Inattention -     buPROPion HCl ER (XL); Take 1 tablet (150 mg total) by mouth every morning.  Dispense: 90 tablet; Refill: 0  Chronic cough   For HTN, advised pt to switch from taking Amlodipine 10mg  at night to in the morning. Monitor blood pressures outside the office and keep bp log. Will follow up in 6 weeks.  Pt didn't tolerate Strattera for inattention. Will stop and changed to Bupropion 150mg  daily. To follow up in 6 weeks. Cough worsening today, advised to start back on her Symbicort 160 mcg 2 puffs inhaled BID. To continue Singulair 10mg  at night.   Return in about 6 weeks (around 07/30/2023) for Virtual visit Hypertension.    Suzan Slick, MD

## 2023-07-06 ENCOUNTER — Other Ambulatory Visit: Payer: Self-pay | Admitting: Family Medicine

## 2023-07-06 DIAGNOSIS — R053 Chronic cough: Secondary | ICD-10-CM

## 2023-07-06 DIAGNOSIS — J309 Allergic rhinitis, unspecified: Secondary | ICD-10-CM

## 2023-07-16 ENCOUNTER — Other Ambulatory Visit: Payer: Self-pay | Admitting: Family Medicine

## 2023-07-16 DIAGNOSIS — K219 Gastro-esophageal reflux disease without esophagitis: Secondary | ICD-10-CM

## 2023-07-30 ENCOUNTER — Telehealth: Payer: BC Managed Care – PPO | Admitting: Family Medicine

## 2023-07-30 ENCOUNTER — Encounter: Payer: Self-pay | Admitting: Family Medicine

## 2023-07-30 VITALS — BP 134/86

## 2023-07-30 DIAGNOSIS — R41840 Attention and concentration deficit: Secondary | ICD-10-CM

## 2023-07-30 DIAGNOSIS — I10 Essential (primary) hypertension: Secondary | ICD-10-CM

## 2023-07-30 MED ORDER — LOSARTAN POTASSIUM 25 MG PO TABS
25.0000 mg | ORAL_TABLET | Freq: Every day | ORAL | 1 refills | Status: DC
Start: 2023-07-30 — End: 2023-08-18

## 2023-07-30 MED ORDER — BUPROPION HCL ER (XL) 150 MG PO TB24: ORAL_TABLET | ORAL | Status: DC

## 2023-07-30 NOTE — Progress Notes (Signed)
Virtual Visit via Video Note  I connected with Katherine Murphy on 07/30/23 at 11:10 AM EDT by a video enabled telemedicine application and verified that I am speaking with the correct person using two identifiers.  Location: Patient: Katherine Murphy, [place]  Provider: [facility]   I discussed the limitations of evaluation and management by telemedicine and the availability of in person appointments. The patient expressed understanding and agreed to proceed.  History of Present Illness: Pt is here for HTN follow up. She's had several in office visits and noted to have HTN. She was started on Amlodipine 10mg  daily for this. She reported taking it at nighttime during last visit. She was advised to monitor at home and start taking medicine in the morning. She says the medicine made her 'yawn' a lot thus why she took at night. She reports checking blood pressures with wrist monitor and has been running 130-150s SBP. She reported this morning it was 134/86 and she took right before the visit. She also has checked at her local pharmacies and reported it being around 150 SBP. She denies headaches, chest pains or SOB>  She also was started on Wellbutrin 150mg  daily for inattention and ADD symptoms. She initially was on Strattera but didn't see any benefit with this. She says she can't tell a difference with the Wellbutrin 150mg  with her ADD symptoms.    Observations/Objective: Physical Exam Vitals reviewed.  Constitutional:      Appearance: Normal appearance. She is obese.  HENT:     Head: Normocephalic and atraumatic.     Right Ear: External ear normal.     Left Ear: External ear normal.     Nose: Nose normal.  Eyes:     Extraocular Movements: Extraocular movements intact.  Pulmonary:     Effort: Pulmonary effort is normal.  Neurological:     General: No focal deficit present.     Mental Status: She is alert and oriented to person, place, and time. Mental status is at  baseline.  Psychiatric:        Mood and Affect: Mood normal.        Behavior: Behavior normal.        Thought Content: Thought content normal.        Judgment: Judgment normal.     Assessment and Plan: Primary hypertension -     Losartan Potassium; Take 1 tablet (25 mg total) by mouth daily.  Dispense: 90 tablet; Refill: 1  Inattention -     buPROPion HCl ER (XL); Take 1.5 tabs po daily     Follow Up Instructions: Although blood pressure is at goal today, she has used her wrist bp monitor and still reports some readings in the SBP 150s. Will add Losartan 25 mg for HTN regimen. She can start back taking the Amlodipine 10mg  in the evenings. Have advised pt to get arm bp monitor and check at home. Follow up in 2-3 weeks for HTN follow up.  Also will increase Wellbutrin from 150mg  daily to 1.5 tabs po daily to help with ADD symptoms. To follow up in 2-3 weeks on this as well.    I discussed the assessment and treatment plan with the patient. The patient was provided an opportunity to ask questions and all were answered. The patient agreed with the plan and demonstrated an understanding of the instructions.   The patient was advised to call back or seek an in-person evaluation if the symptoms worsen or if  the condition fails to improve as anticipated.     Suzan Slick, MD

## 2023-08-02 ENCOUNTER — Other Ambulatory Visit: Payer: Self-pay | Admitting: Family Medicine

## 2023-08-02 DIAGNOSIS — I10 Essential (primary) hypertension: Secondary | ICD-10-CM

## 2023-08-18 ENCOUNTER — Telehealth (INDEPENDENT_AMBULATORY_CARE_PROVIDER_SITE_OTHER): Payer: BC Managed Care – PPO | Admitting: Family Medicine

## 2023-08-18 ENCOUNTER — Encounter: Payer: Self-pay | Admitting: Family Medicine

## 2023-08-18 VITALS — BP 136/75

## 2023-08-18 DIAGNOSIS — R41840 Attention and concentration deficit: Secondary | ICD-10-CM | POA: Diagnosis not present

## 2023-08-18 DIAGNOSIS — I10 Essential (primary) hypertension: Secondary | ICD-10-CM | POA: Diagnosis not present

## 2023-08-18 MED ORDER — LOSARTAN POTASSIUM 25 MG PO TABS
50.0000 mg | ORAL_TABLET | Freq: Every day | ORAL | Status: DC
Start: 2023-08-18 — End: 2023-09-29

## 2023-08-18 MED ORDER — BUPROPION HCL ER (XL) 300 MG PO TB24
300.0000 mg | ORAL_TABLET | Freq: Every day | ORAL | 0 refills | Status: DC
Start: 2023-08-18 — End: 2023-11-28

## 2023-08-18 NOTE — Progress Notes (Signed)
Virtual Visit via Video Note  I connected with Katherine Murphy on 08/18/23 at 10:30 AM EDT by a video enabled telemedicine application and verified that I am speaking with the correct person using two identifiers.  Location: Patient: work in [place], [place] Provider: [facility] at [facility]   I discussed the limitations of evaluation and management by telemedicine and the availability of in person appointments. The patient expressed understanding and agreed to proceed.  History of Present Illness:   pt is here for 4 week follow up on ADHD and HTN. She was started on Wellbutrin 150mg  daily and titrated up. Last visit 4 weeks ago, she was advised to increase the Wellbutrin 150mg  to 1.5 tabs daily. She's been doing this and can't tell a difference. She is still easily distracted and it's difficult to focus at work to complete tasks.  She was also noted to have elevated HTN. She was taking Amlodipine 10mg  but still was uncontrolled. 4 weeks ago, she was added on Losartan 25mg  daily. She reports she takes the Amlodipine at night and Losartan in the morning. Checking her blood pressures at home throughout the day and it's been running 130s-150s sbp.   Observations/Objective: Physical Exam Vitals reviewed.  Constitutional:      Appearance: Normal appearance. She is obese.  HENT:     Head: Normocephalic and atraumatic.     Right Ear: External ear normal.     Left Ear: External ear normal.     Nose: Nose normal.  Eyes:     Extraocular Movements: Extraocular movements intact.  Neurological:     General: No focal deficit present.     Mental Status: She is alert and oriented to person, place, and time. Mental status is at baseline.  Psychiatric:        Mood and Affect: Mood normal.        Behavior: Behavior normal.        Thought Content: Thought content normal.        Judgment: Judgment normal.     Assessment and Plan:  Primary hypertension -     Losartan Potassium; Take 2  tablets (50 mg total) by mouth daily.  Inattention -     buPROPion HCl ER (XL); Take 1 tablet (300 mg total) by mouth daily.  Dispense: 90 tablet; Refill: 0    Follow Up Instructions:   blood pressure still not at goal. Will increase Losartan to 50mg  from 25mg  . To take 2 of the 25mg  daily. Continue to check at home and follow up in 4 weeks. Inattention worsening, increase Wellbutrin from 150mg  to 300mg  daily. New rx sent to pharmacy. Follow up in 4 weeks.  I discussed the assessment and treatment plan with the patient. The patient was provided an opportunity to ask questions and all were answered. The patient agreed with the plan and demonstrated an understanding of the instructions.   The patient was advised to call back or seek an in-person evaluation if the symptoms worsen or if the condition fails to improve as anticipated.  I provided 8 minutes of non-face-to-face time during this encounter.   Suzan Slick, MD

## 2023-09-07 ENCOUNTER — Other Ambulatory Visit: Payer: Self-pay | Admitting: Family Medicine

## 2023-09-07 DIAGNOSIS — K219 Gastro-esophageal reflux disease without esophagitis: Secondary | ICD-10-CM

## 2023-09-29 ENCOUNTER — Encounter: Payer: Self-pay | Admitting: Family Medicine

## 2023-09-29 ENCOUNTER — Encounter: Payer: Self-pay | Admitting: Obstetrics and Gynecology

## 2023-09-29 ENCOUNTER — Other Ambulatory Visit: Payer: Self-pay | Admitting: *Deleted

## 2023-09-29 DIAGNOSIS — Z3041 Encounter for surveillance of contraceptive pills: Secondary | ICD-10-CM

## 2023-09-29 DIAGNOSIS — I10 Essential (primary) hypertension: Secondary | ICD-10-CM

## 2023-09-29 MED ORDER — LOSARTAN POTASSIUM 50 MG PO TABS: 50.0000 mg | ORAL_TABLET | Freq: Every day | ORAL | 1 refills | Status: DC

## 2023-09-29 NOTE — Telephone Encounter (Signed)
Med refill request:Tri-estarylla Last AEX: 10/03/22 -BS Next AEX: 01/20/24 -BS Last MMG (if hormonal med) 09/22/20   Call placed to patient, no screening MMG on file. Advised will need updated MMG prior to next AEX. Patient agreeable and will contact TBC to schedule. Advised I will send current request to Dr. Edward Jolly for review and f/u if any additional recommendations. Patient agreeable.

## 2023-10-01 NOTE — Telephone Encounter (Signed)
Spoke with patient, advised per Dr. Edward Jolly. Patient agreeable, OV scheduled for 12/31 at 1400 to further discuss. Patient aware to call if any concerns in the meantime.   Medication discontinued.   Routing to provider for final review. Patient is agreeable to disposition. Will close encounter.

## 2023-10-01 NOTE — Addendum Note (Signed)
Addended by: Leda Min on: 10/01/2023 09:40 AM   Modules accepted: Orders

## 2023-10-15 ENCOUNTER — Other Ambulatory Visit: Payer: Self-pay | Admitting: Obstetrics and Gynecology

## 2023-10-15 DIAGNOSIS — Z1231 Encounter for screening mammogram for malignant neoplasm of breast: Secondary | ICD-10-CM

## 2023-10-17 ENCOUNTER — Other Ambulatory Visit: Payer: Self-pay | Admitting: Obstetrics and Gynecology

## 2023-10-17 DIAGNOSIS — Z3041 Encounter for surveillance of contraceptive pills: Secondary | ICD-10-CM

## 2023-10-17 NOTE — Telephone Encounter (Signed)
Med refill request: Tri-[facility] Last AEX: 10/03/22 Next AEX: 11/04/23 Last MMG (if hormonal med) 09/22/20 BI-RADS 1 negative, scheduled 11/10/23 Refill authorized: [facility] #[id number]. Sent to provider for review.

## 2023-10-21 NOTE — Progress Notes (Signed)
 GYNECOLOGY  VISIT   HPI: 52 y.o.   Single  Caucasian female   G2P0020 with Patient's last menstrual period was 10/21/2023.   here for: birth control consult. Took birth control pills since age 72.   She stopped this first of December upon my recommendation due to her new dx of HTN.  Taking Norvasc  and Cozaar .  Had regular menstrual cycles on her birth control pills.   No hot flashes or night sweats.    Not currently sexually active.   Lost 25 pounds intentionally but also due to stress.   GYNECOLOGIC HISTORY: Patient's last menstrual period was 10/21/2023. Contraception:  off OCP Menopausal hormone therapy:  n/a Last 2 paps:  08/23/2021 neg,hpv neg  08/18/20 Negative: HR HPV negative, 03-07-17 ASCUS:Neg HR HPV  History of abnormal Pap or positive HPV:  yes,  Hx Cryotherapy to cervix  Mammogram:  10/02/20 Breast Density Cat C, BI-RADS CAT 1 neg        OB History     Gravida  2   Para  0   Term  0   Preterm      AB  2   Living  0      SAB      IAB  2   Ectopic      Multiple      Live Births                 Patient Active Problem List   Diagnosis Date Noted   Gastroesophageal reflux disease 04/10/2023   Neck pain 05/29/2022   Chronic cough 05/23/2022   Psoriasis 12/04/2020   Allergic rhinitis 04/21/2014    Past Medical History:  Diagnosis Date   Abnormal Pap smear    CIN I (cervical intraepithelial neoplasia I) 08/31/2020   dysplasia in a large cervical polyp that was removed   Elevated hemoglobin A1c 2021   HTN (hypertension)    Psoriasis     Past Surgical History:  Procedure Laterality Date   BIOPSY  09/10/2019   Procedure: BIOPSY;  Surgeon: Rollin Dover, MD;  Location: [facility];  Service: Endoscopy;;   CRYOTHERAPY     ESOPHAGOGASTRODUODENOSCOPY (EGD) WITH PROPOFOL  N/A 09/10/2019   Procedure: ESOPHAGOGASTRODUODENOSCOPY (EGD) WITH PROPOFOL ;  Surgeon: Rollin Dover, MD;  Location: [facility];  Service: Endoscopy;  Laterality: N/A;    KNEE SURGERY Left    LEEP  2021   LGSIL in large cervical polyp   SAVORY DILATION N/A 09/10/2019   Procedure: SAVORY DILATION;  Surgeon: Rollin Dover, MD;  Location: [facility];  Service: Endoscopy;  Laterality: N/A;    Current Outpatient Medications  Medication Sig Dispense Refill   albuterol  (VENTOLIN  HFA) 108 (90 Base) MCG/ACT inhaler Inhale 2 puffs into the lungs every 6 (six) hours as needed for wheezing or shortness of breath. 8 g 0   amLODipine  (NORVASC ) 10 MG tablet TAKE 1 TABLET BY MOUTH EVERY DAY 90 tablet 0   budesonide -formoterol  (SYMBICORT ) 160-4.5 MCG/ACT inhaler Inhale 2 puffs into the lungs 2 (two) times daily. 1 each 12   buPROPion  (WELLBUTRIN  XL) 300 MG 24 hr tablet Take 1 tablet (300 mg total) by mouth daily. 90 tablet 0   losartan  (COZAAR ) 50 MG tablet Take 1 tablet (50 mg total) by mouth daily. 90 tablet 1   montelukast  (SINGULAIR ) 10 MG tablet TAKE 1 TABLET BY MOUTH EVERYDAY AT BEDTIME 90 tablet 1   pantoprazole  (PROTONIX ) 40 MG tablet TAKE 1 TABLET BY MOUTH EVERY DAY 30 tablet 1  No current facility-administered medications for this visit.     ALLERGIES: Patient has no known allergies.  Family History  Problem Relation Age of Onset   Healthy Mother    Healthy Father     Social History   Socioeconomic History   Marital status: Single    Spouse name: Not on file   Number of children: Not on file   Years of education: Not on file   Highest education level: Bachelor's degree (e.g., BA, AB, BS)  Occupational History   Not on file  Tobacco Use   Smoking status: Never    Passive exposure: Never   Smokeless tobacco: Never  Vaping Use   Vaping status: Never Used  Substance and Sexual Activity   Alcohol use: No   Drug use: No   Sexual activity: Yes    Partners: Male    Birth control/protection: Pill  Other Topics Concern   Not on file  Social History Narrative   Not on file   Social Drivers of Health   Financial Resource Strain: Low Risk   (06/18/2023)   Overall Financial Resource Strain (CARDIA)    Difficulty of Paying Living Expenses: Not very hard  Food Insecurity: No Food Insecurity (06/18/2023)   Hunger Vital Sign    Worried About Running Out of Food in the Last Year: Never true    Ran Out of Food in the Last Year: Never true  Transportation Needs: No Transportation Needs (06/18/2023)   PRAPARE - Administrator, Civil Service (Medical): No    Lack of Transportation (Non-Medical): No  Physical Activity: Insufficiently Active (06/18/2023)   Exercise Vital Sign    Days of Exercise per Week: 2 days    Minutes of Exercise per Session: 30 min  Stress: Stress Concern Present (06/18/2023)   [employer] of Occupational Health - Occupational Stress Questionnaire    Feeling of Stress : To some extent  Social Connections: Moderately Isolated (06/18/2023)   Social Connection and Isolation Panel [NHANES]    Frequency of Communication with Friends and Family: Twice a week    Frequency of Social Gatherings with Friends and Family: Once a week    Attends Religious Services: Never    Database Administrator or Organizations: No    Attends Engineer, Structural: Not on file    Marital Status: Living with partner  Intimate Partner Violence: Not on file    Review of Systems  All other systems reviewed and are negative.   PHYSICAL EXAMINATION:   BP 130/82 (BP Location: Right Arm, Patient Position: Sitting, Cuff Size: Large)   Pulse 100   Ht 5' 7 (1.702 m)   Wt 273 lb (123.8 kg)   LMP 10/21/2023   SpO2 97%   BMI 42.76 kg/m     General appearance: alert, cooperative and appears stated age  ASSESSMENT:  Contraception counseling.  HTN.  PLAN:  Will check FSH and estradiol .  We discussed the need to stay off of estrogen containing contraception due to increased risk of cardiovascular events due to her HTN. Contraceptive tx options discussed:  condoms, progesterone only birth control pills, Nexplanon,  Depo Provera, IUDs, tubal sterilization.  Patient would like to try Micronor .  Instructed in used.  She understands that she needs to take this medication the same time daily and be prompt. FU for annual exam and prn.   25 min  total time was spent for this patient encounter, including preparation, face-to-face counseling with the patient, coordination  of care, and documentation of the encounter.

## 2023-10-23 ENCOUNTER — Other Ambulatory Visit: Payer: Self-pay | Admitting: Family Medicine

## 2023-10-23 DIAGNOSIS — K219 Gastro-esophageal reflux disease without esophagitis: Secondary | ICD-10-CM

## 2023-11-04 ENCOUNTER — Ambulatory Visit: Payer: BC Managed Care – PPO | Admitting: Obstetrics and Gynecology

## 2023-11-04 ENCOUNTER — Encounter: Payer: Self-pay | Admitting: Obstetrics and Gynecology

## 2023-11-04 VITALS — BP 130/82 | HR 100 | Ht 67.0 in | Wt 273.0 lb

## 2023-11-04 DIAGNOSIS — E559 Vitamin D deficiency, unspecified: Secondary | ICD-10-CM | POA: Diagnosis not present

## 2023-11-04 DIAGNOSIS — Z3009 Encounter for other general counseling and advice on contraception: Secondary | ICD-10-CM | POA: Diagnosis not present

## 2023-11-04 DIAGNOSIS — R7303 Prediabetes: Secondary | ICD-10-CM | POA: Diagnosis not present

## 2023-11-04 DIAGNOSIS — I10 Essential (primary) hypertension: Secondary | ICD-10-CM | POA: Diagnosis not present

## 2023-11-04 MED ORDER — NORETHINDRONE 0.35 MG PO TABS
1.0000 | ORAL_TABLET | Freq: Every day | ORAL | 0 refills | Status: DC
Start: 2023-11-04 — End: 2024-01-20

## 2023-11-04 NOTE — Patient Instructions (Signed)
 Norethindrone Tablets (Contraception) What is this medication? NORETHINDRONE (nor eth IN drone) prevents ovulation and pregnancy. It belongs to a group of medications called contraceptives. This medication is a progestin hormone. This medicine may be used for other purposes; ask your health care provider or pharmacist if you have questions. COMMON BRAND NAME(S): Camila, Deblitane 28-Day, [place]in, [place], [place], [place]iv[place], [place], Nor-QD, Nora-BE, Norlyroc, Ortho Micronor, [employer] 28-Day What should I tell my care team before I take this medication? They need to know if you have any of these conditions: Blood vessel disease or blood clots Breast, cervical, or vaginal cancer Diabetes Heart disease Kidney disease Liver disease Mental depression Migraine Seizures Stroke Vaginal bleeding An unusual or allergic reaction to norethindrone, other medications, foods, dyes, or preservatives Pregnant or trying to get pregnant Breast-feeding How should I use this medication? Take this medication by mouth with a glass of water. You may take it with or without food. Follow the directions on the prescription label. Take this medication at the same time each day and in the order directed on the package. Do not take your medication more often than directed. Contact your care team about the use of this medication in children. Special care may be needed. This medication has been used in female children who have started having menstrual periods. A patient package insert for the product will be given with each prescription and refill. Read this sheet carefully each time. The sheet may change frequently. Overdosage: If you think you have taken too much of this medicine contact a poison control center or emergency room at once. NOTE: This medicine is only for you. Do not share this medicine with others. What if I miss a dose? If you miss a dose, refer to the patient package insert for instruction. This  medication may not work as well if you miss more than 1 pill. You may need to use back-up contraception. What may interact with this medication? Do not take this medication with any of the following: Amprenavir or fosamprenavir Bosentan This medication may also interact with the following: Antibiotics or medications for infections, especially rifampin, rifabutin, rifapentine, and griseofulvin, and possibly penicillins or tetracyclines Aprepitant Barbiturate medications, such as phenobarbital Carbamazepine Felbamate Modafinil Oxcarbazepine Phenytoin Ritonavir or other medications for HIV infection or AIDS St. John's wort Topiramate This list may not describe all possible interactions. Give your health care provider a list of all the medicines, herbs, non-prescription drugs, or dietary supplements you use. Also tell them if you smoke, drink alcohol, or use illegal drugs. Some items may interact with your medicine. What should I watch for while using this medication? Visit your care team for regular health checks while on this medication. You may need to use another form of contraception, such as a condom, during the first cycle of this medication. If you may be pregnant, stop taking this medication right away and contact your care team. What side effects may I notice from receiving this medication? Side effects that you should report to your care team as soon as possible: Allergic reactions--skin rash, itching, hives, swelling of the face, lips, tongue, or throat Blood clot--pain, swelling, or warmth in the leg, shortness of breath, chest pain Gallbladder problems--severe stomach pain, nausea, vomiting, fever Increase in blood pressure Liver injury--right upper belly pain, loss of appetite, nausea, light-colored stool, dark yellow or brown urine, yellowing skin or eyes, unusual weakness or fatigue New or worsening migraines or headaches Stroke--sudden numbness or weakness of the face,  arm, or leg,  trouble speaking, confusion, trouble walking, loss of balance or coordination, dizziness, severe headache, change in vision Unusual vaginal discharge, itching, or odor Worsening mood, feelings of depression Side effects that usually do not require medical attention (report to your care team if they continue or are bothersome): Breast pain or tenderness Dark patches of skin on the face or other sun-exposed areas Irregular menstrual cycles or spotting Nausea Weight gain This list may not describe all possible side effects. Call your doctor for medical advice about side effects. You may report side effects to FDA at 1-800-FDA-1088. Where should I keep my medication? Keep out of the reach of children and pets. Store at room temperature between 15 and 30 degrees C (59 and 86 degrees F). Throw away any unused medication after the expiration date. NOTE: This sheet is a summary. It may not cover all possible information. If you have questions about this medicine, talk to your doctor, pharmacist, or health care provider.  2024 Elsevier/Gold Standard (2023-03-28 00:00:00)

## 2023-11-05 LAB — FOLLICLE STIMULATING HORMONE: FSH: 12.5 m[IU]/mL

## 2023-11-05 LAB — ESTRADIOL: Estradiol: 124 pg/mL

## 2023-11-08 ENCOUNTER — Other Ambulatory Visit: Payer: Self-pay | Admitting: Family Medicine

## 2023-11-08 DIAGNOSIS — I10 Essential (primary) hypertension: Secondary | ICD-10-CM

## 2023-11-10 ENCOUNTER — Ambulatory Visit
Admission: RE | Admit: 2023-11-10 | Discharge: 2023-11-10 | Disposition: A | Discharge status: Home / Self Care | Payer: BC Managed Care – PPO | Source: Ambulatory Visit | Attending: Obstetrics and Gynecology | Admitting: Obstetrics and Gynecology

## 2023-11-10 DIAGNOSIS — Z1231 Encounter for screening mammogram for malignant neoplasm of breast: Secondary | ICD-10-CM | POA: Diagnosis not present

## 2023-11-28 ENCOUNTER — Other Ambulatory Visit: Payer: Self-pay | Admitting: Family Medicine

## 2023-11-28 DIAGNOSIS — R41840 Attention and concentration deficit: Secondary | ICD-10-CM

## 2023-11-29 ENCOUNTER — Other Ambulatory Visit: Payer: Self-pay | Admitting: Family Medicine

## 2023-11-29 DIAGNOSIS — K219 Gastro-esophageal reflux disease without esophagitis: Secondary | ICD-10-CM

## 2023-12-01 MED ORDER — BUPROPION HCL ER (XL) 300 MG PO TB24
300.0000 mg | ORAL_TABLET | Freq: Every day | ORAL | 0 refills | Status: DC
Start: 2023-12-01 — End: 2024-03-08

## 2024-01-06 NOTE — Progress Notes (Signed)
 53 y.o. [id number] Single Caucasian female here for annual exam.    Started Micronor in after December visit.  New dx of HTN so she needed to stop her combined oral contraception and make a switch to progesterone only contraception.  Has occasional spotting when not on her cycle, 3 - 4 times.  No missed pills.  Spotting today.     No irregular bleeding on her prior combined oral contraceptive pills.   Labs done 11/04/23:  FSH 12.5 E2 124.  She is asking for general labs to be drawn today.   Right knee pain at night, consistently in the early hours of each morning.  Wakes her up.   Hx psoriasis.  Not sexually active for one year.   PCP: Suzan Slick, MD   Patient's last menstrual period was 01/02/2024 (approximate).     Period Cycle (Days): 28 Period Duration (Days): 5-6 Period Pattern: Regular Menstrual Flow: Moderate, Light Menstrual Control: Maxi pad Dysmenorrhea: None     Sexually active:  Not currently  The current method of family planning is POP  Menopausal hormone therapy:  n/a Exercising: No.     Smoker:  no  OB History  Gravida Para Term Preterm AB Living  2 0 0  2 0  SAB IAB Ectopic Multiple Live Births   2       # Outcome Date GA Lbr Len/2nd Weight Sex Type Anes PTL Lv  2 IAB           1 IAB              HEALTH MAINTENANCE: Last 2 paps:  10/03/22 neg: HR HPV neg, 08/23/21 neg: HR HPV neg History of abnormal Pap or positive HPV:  yes, Hx Cryotherapy to cervix.  Hx CIN I in cervical polyp 08/31/20.   Mammogram:   11/10/23 Breast Density Cat C, BI-RADS CAT 1 neg Colonoscopy: NA Bone Density:  n/a  Result  n/a   Immunization History  Administered Date(s) Administered   Tdap 05/13/2008, 05/06/2018      reports that she has never smoked. She has never been exposed to tobacco smoke. She has never used smokeless tobacco. She reports that she does not drink alcohol and does not use drugs.  Past Medical History:  Diagnosis Date   Abnormal Pap  smear    CIN I (cervical intraepithelial neoplasia I) 08/31/2020   dysplasia in a large cervical polyp that was removed   Elevated hemoglobin A1c 2021   HTN (hypertension)    Psoriasis     Past Surgical History:  Procedure Laterality Date   BIOPSY  09/10/2019   Procedure: BIOPSY;  Surgeon: Jeani Hawking, MD;  Location: [facility];  Service: Endoscopy;;   CRYOTHERAPY     ESOPHAGOGASTRODUODENOSCOPY (EGD) WITH PROPOFOL N/A 09/10/2019   Procedure: ESOPHAGOGASTRODUODENOSCOPY (EGD) WITH PROPOFOL;  Surgeon: Jeani Hawking, MD;  Location: [facility];  Service: Endoscopy;  Laterality: N/A;   KNEE SURGERY Left    LEEP  2021   LGSIL in large cervical polyp   SAVORY DILATION N/A 09/10/2019   Procedure: SAVORY DILATION;  Surgeon: Jeani Hawking, MD;  Location: [facility];  Service: Endoscopy;  Laterality: N/A;    Current Outpatient Medications  Medication Sig Dispense Refill   budesonide-formoterol (SYMBICORT) 160-4.5 MCG/ACT inhaler Inhale 2 puffs into the lungs 2 (two) times daily. 1 each 12   buPROPion (WELLBUTRIN XL) 300 MG 24 hr tablet Take 1 tablet (300 mg total) by mouth daily. 90 tablet 0  losartan (COZAAR) 50 MG tablet Take 1 tablet (50 mg total) by mouth daily. 90 tablet 1   montelukast (SINGULAIR) 10 MG tablet TAKE 1 TABLET BY MOUTH EVERYDAY AT BEDTIME 90 tablet 1   pantoprazole (PROTONIX) 40 MG tablet TAKE 1 TABLET BY MOUTH EVERY DAY 90 tablet 1   No current facility-administered medications for this visit.    ALLERGIES: Patient has no known allergies.  Family History  Problem Relation Age of Onset   Healthy Mother    Healthy Father     Review of Systems  All other systems reviewed and are negative.   PHYSICAL EXAM:  BP 124/84 (BP Location: Left Arm, Patient Position: Sitting, Cuff Size: Large)   Pulse 89   Ht 5\' 8"  (1.727 m)   Wt 279 lb (126.6 kg)   LMP 01/02/2024 (Approximate)   SpO2 96%   BMI 42.42 kg/m     General appearance: alert, cooperative and appears  stated age Head: normocephalic, without obvious abnormality, atraumatic Neck: no adenopathy, supple, symmetrical, trachea midline and thyroid normal to inspection and palpation Lungs: clear to auscultation bilaterally Breasts: normal appearance, no masses or tenderness, No nipple retraction or dimpling, No nipple discharge or bleeding, No axillary adenopathy Heart: regular rate and rhythm Abdomen: soft, non-tender; no masses, no organomegaly Extremities: extremities normal, atraumatic, no cyanosis or edema Skin: skin color, texture, turgor normal. Plaques of scaling red raised lesions of skin.  Lymph nodes: cervical, supraclavicular, and axillary nodes normal. Neurologic: grossly normal  Pelvic: External genitalia:  no lesions              No abnormal inguinal nodes palpated.              Urethra:  normal appearing urethra with no masses, tenderness or lesions              Bartholins and Skenes: normal                 Vagina: normal appearing vagina with normal color and discharge, no lesions              Cervix: no lesions.  Dark spotting today.                Pap taken: no Bimanual Exam:  Uterus:  normal size, contour, position, consistency, mobility, non-tender              Adnexa: no mass, fullness, tenderness              Rectal exam: yes.  Confirms.              Anus:  normal sphincter tone, no lesions  Chaperone was present for exam:   Jada, CMA  ASSESSMENT: Well woman visit with gynecologic exam. LGSIL of large cervical polyp 08/31/20. On POPs.  Irregular bleeding.  Hx low vit D.  Hx elevated A1C.  Psoriasis.  Right knee pain.  Colon cancer screening.  She prefers Cologuard.  PHQ-9: 0  PLAN: Mammogram screening discussed. Self breast awareness reviewed. Pap and HRV collected:  no.  Due in 2026.  Guidelines for Calcium, Vitamin D, regular exercise program including cardiovascular and weight bearing exercise. Medication refills:  Micronor for one year.  She will let  me know if her irregular bleeding persists after completing her third pack of pills.  Will do routine labs today.  She will have her PCP due this in the future.  Cologuard ordered.  I recommend she follow up with her PCP regarding  her knee pain.  Follow up:  yearly and prn.

## 2024-01-17 ENCOUNTER — Other Ambulatory Visit: Payer: Self-pay | Admitting: Family Medicine

## 2024-01-17 DIAGNOSIS — R053 Chronic cough: Secondary | ICD-10-CM

## 2024-01-17 DIAGNOSIS — J309 Allergic rhinitis, unspecified: Secondary | ICD-10-CM

## 2024-01-20 ENCOUNTER — Ambulatory Visit (INDEPENDENT_AMBULATORY_CARE_PROVIDER_SITE_OTHER): Payer: BC Managed Care – PPO | Admitting: Obstetrics and Gynecology

## 2024-01-20 ENCOUNTER — Encounter: Payer: Self-pay | Admitting: Obstetrics and Gynecology

## 2024-01-20 VITALS — BP 124/84 | HR 89 | Ht 68.0 in | Wt 279.0 lb

## 2024-01-20 DIAGNOSIS — Z01419 Encounter for gynecological examination (general) (routine) without abnormal findings: Secondary | ICD-10-CM

## 2024-01-20 DIAGNOSIS — Z1331 Encounter for screening for depression: Secondary | ICD-10-CM | POA: Diagnosis not present

## 2024-01-20 DIAGNOSIS — Z1211 Encounter for screening for malignant neoplasm of colon: Secondary | ICD-10-CM

## 2024-01-20 DIAGNOSIS — Z0000 Encounter for general adult medical examination without abnormal findings: Secondary | ICD-10-CM | POA: Diagnosis not present

## 2024-01-20 MED ORDER — NORETHINDRONE 0.35 MG PO TABS: 1.0000 | ORAL_TABLET | Freq: Every day | ORAL | 3 refills | Status: AC

## 2024-01-20 NOTE — Patient Instructions (Signed)

## 2024-01-21 ENCOUNTER — Other Ambulatory Visit: Payer: Self-pay | Admitting: Obstetrics and Gynecology

## 2024-01-21 DIAGNOSIS — R7989 Other specified abnormal findings of blood chemistry: Secondary | ICD-10-CM

## 2024-01-21 DIAGNOSIS — D75839 Thrombocytosis, unspecified: Secondary | ICD-10-CM

## 2024-01-21 LAB — COMPREHENSIVE METABOLIC PANEL
AG Ratio: 1.4 (calc) (ref 1.0–2.5)
ALT: 16 U/L (ref 6–29)
AST: 16 U/L (ref 10–35)
Albumin: 4.3 g/dL (ref 3.6–5.1)
Alkaline phosphatase (APISO): 146 U/L (ref 37–153)
BUN: 10 mg/dL (ref 7–25)
CO2: 25 mmol/L (ref 20–32)
Calcium: 9.4 mg/dL (ref 8.6–10.4)
Chloride: 106 mmol/L (ref 98–110)
Creat: 0.75 mg/dL (ref 0.50–1.03)
Globulin: 3.1 g/dL (ref 1.9–3.7)
Glucose, Bld: 97 mg/dL (ref 65–99)
Potassium: 4 mmol/L (ref 3.5–5.3)
Sodium: 140 mmol/L (ref 135–146)
Total Bilirubin: 0.5 mg/dL (ref 0.2–1.2)
Total Protein: 7.4 g/dL (ref 6.1–8.1)
eGFR: 96 mL/min/{1.73_m2} (ref >=60)

## 2024-01-21 LAB — LIPID PANEL
Cholesterol: 159 mg/dL (ref <200)
HDL: 67 mg/dL (ref >=50)
LDL Cholesterol (Calc): 76 mg/dL
Non-HDL Cholesterol (Calc): 92 mg/dL (ref <130)
Total CHOL/HDL Ratio: 2.4 (calc) (ref <5.0)
Triglycerides: 75 mg/dL (ref <150)

## 2024-01-21 LAB — CBC
HCT: 38.9 % (ref 35.0–45.0)
Hemoglobin: 12.4 g/dL (ref 11.7–15.5)
MCH: 25.5 pg — ABNORMAL LOW (ref 27.0–33.0)
MCHC: 31.9 g/dL — ABNORMAL LOW (ref 32.0–36.0)
MCV: 80 fL (ref 80.0–100.0)
MPV: 11.5 fL (ref 7.5–12.5)
Platelets: 450 10*3/uL — ABNORMAL HIGH (ref 140–400)
RBC: 4.86 10*6/uL (ref 3.80–5.10)
RDW: 14.6 % (ref 11.0–15.0)
WBC: 9.6 10*3/uL (ref 3.8–10.8)

## 2024-01-21 LAB — HEMOGLOBIN A1C
Hgb A1c MFr Bld: 5.7 %{Hb} — ABNORMAL HIGH (ref <5.7)
Mean Plasma Glucose: 117 mg/dL
eAG (mmol/L): 6.5 mmol/L

## 2024-01-21 LAB — VITAMIN D 25 HYDROXY (VIT D DEFICIENCY, FRACTURES): Vit D, 25-Hydroxy: 18 ng/mL — ABNORMAL LOW (ref 30–100)

## 2024-01-22 ENCOUNTER — Other Ambulatory Visit: Payer: Self-pay | Admitting: Family Medicine

## 2024-01-22 ENCOUNTER — Other Ambulatory Visit: Payer: Self-pay | Admitting: *Deleted

## 2024-01-22 DIAGNOSIS — I10 Essential (primary) hypertension: Secondary | ICD-10-CM

## 2024-01-22 MED ORDER — VITAMIN D (ERGOCALCIFEROL) 1.25 MG (50000 UNIT) PO CAPS
50000.0000 [IU] | ORAL_CAPSULE | ORAL | 0 refills | Status: AC
Start: 2024-01-22 — End: ?

## 2024-02-01 ENCOUNTER — Other Ambulatory Visit: Payer: Self-pay | Admitting: Family Medicine

## 2024-02-01 DIAGNOSIS — I10 Essential (primary) hypertension: Secondary | ICD-10-CM

## 2024-02-14 ENCOUNTER — Other Ambulatory Visit: Payer: Self-pay | Admitting: Family Medicine

## 2024-02-14 DIAGNOSIS — I10 Essential (primary) hypertension: Secondary | ICD-10-CM

## 2024-03-06 ENCOUNTER — Other Ambulatory Visit: Payer: Self-pay | Admitting: Family Medicine

## 2024-03-06 DIAGNOSIS — R41840 Attention and concentration deficit: Secondary | ICD-10-CM

## 2024-03-08 ENCOUNTER — Other Ambulatory Visit: Payer: Self-pay | Admitting: Family Medicine

## 2024-03-08 DIAGNOSIS — R41840 Attention and concentration deficit: Secondary | ICD-10-CM

## 2024-03-08 MED ORDER — BUPROPION HCL ER (XL) 300 MG PO TB24: 300.0000 mg | ORAL_TABLET | Freq: Every day | ORAL | 0 refills | Status: DC

## 2024-03-25 ENCOUNTER — Other Ambulatory Visit

## 2024-03-25 DIAGNOSIS — R7989 Other specified abnormal findings of blood chemistry: Secondary | ICD-10-CM | POA: Diagnosis not present

## 2024-03-25 DIAGNOSIS — D75839 Thrombocytosis, unspecified: Secondary | ICD-10-CM | POA: Diagnosis not present

## 2024-03-25 DIAGNOSIS — R7303 Prediabetes: Secondary | ICD-10-CM | POA: Diagnosis not present

## 2024-03-25 DIAGNOSIS — E559 Vitamin D deficiency, unspecified: Secondary | ICD-10-CM | POA: Diagnosis not present

## 2024-03-26 ENCOUNTER — Ambulatory Visit (INDEPENDENT_AMBULATORY_CARE_PROVIDER_SITE_OTHER)

## 2024-03-26 ENCOUNTER — Ambulatory Visit: Payer: Self-pay | Admitting: Obstetrics and Gynecology

## 2024-03-26 ENCOUNTER — Ambulatory Visit (INDEPENDENT_AMBULATORY_CARE_PROVIDER_SITE_OTHER): Admitting: Family Medicine

## 2024-03-26 ENCOUNTER — Ambulatory Visit: Payer: Self-pay

## 2024-03-26 VITALS — BP 165/95 | HR 88 | Temp 98.1°F | Resp 18 | Ht 68.0 in | Wt 286.5 lb

## 2024-03-26 DIAGNOSIS — M25561 Pain in right knee: Secondary | ICD-10-CM | POA: Diagnosis not present

## 2024-03-26 DIAGNOSIS — G8929 Other chronic pain: Secondary | ICD-10-CM

## 2024-03-26 DIAGNOSIS — I10 Essential (primary) hypertension: Secondary | ICD-10-CM | POA: Diagnosis not present

## 2024-03-26 DIAGNOSIS — L409 Psoriasis, unspecified: Secondary | ICD-10-CM

## 2024-03-26 DIAGNOSIS — M1711 Unilateral primary osteoarthritis, right knee: Secondary | ICD-10-CM | POA: Diagnosis not present

## 2024-03-26 LAB — CBC
HCT: 38.3 % (ref 35.0–45.0)
Hemoglobin: 12 g/dL (ref 11.7–15.5)
MCH: 25.4 pg — ABNORMAL LOW (ref 27.0–33.0)
MCHC: 31.3 g/dL — ABNORMAL LOW (ref 32.0–36.0)
MCV: 81.1 fL (ref 80.0–100.0)
MPV: 11.5 fL (ref 7.5–12.5)
Platelets: 404 10*3/uL — ABNORMAL HIGH (ref 140–400)
RBC: 4.72 10*6/uL (ref 3.80–5.10)
RDW: 14.6 % (ref 11.0–15.0)
WBC: 8.7 10*3/uL (ref 3.8–10.8)

## 2024-03-26 LAB — VITAMIN D 25 HYDROXY (VIT D DEFICIENCY, FRACTURES): Vit D, 25-Hydroxy: 35 ng/mL (ref 30–100)

## 2024-03-26 MED ORDER — MELOXICAM 15 MG PO TABS: 15.0000 mg | ORAL_TABLET | Freq: Every day | ORAL | 1 refills | Status: DC | PRN

## 2024-03-26 MED ORDER — LOSARTAN POTASSIUM 100 MG PO TABS: 100.0000 mg | ORAL_TABLET | Freq: Every day | ORAL | 1 refills | Status: DC

## 2024-03-26 MED ORDER — METHYLPREDNISOLONE 4 MG PO TBPK: ORAL_TABLET | ORAL | 0 refills | Status: AC

## 2024-03-26 NOTE — Telephone Encounter (Signed)
  Chief Complaint: knee pain Symptoms: right knee pain that is worse at night Frequency: pain has worsen over the last couple of weeks Pertinent Negatives: Patient denies fever, CP, SOB Disposition: [] ED /[] Urgent Care (no appt availability in office) / [x] Appointment(In office/virtual)/ []  [facility]/ [] Home Care/ [] Refused Recommended Disposition /[] [facility]/ []  Follow-up with PCP Additional Notes: patient reports right knee pain that has been going on for several weeks but pain has gotten progressively worse over recent weeks. Patient reports no injury with initial knee pain but patient states she was cleaning out her basement and was walking up and down stairs multiple times recently.patient endorses having psoriasis. Patient is questioning if the knee pain has gotten worse due to increases in her inflammation. Patient is recommended to be seen within three days. Patient requesting same day appointment. Appointment scheduled for today at 10:10 AM with PCP. Patient verbalized understanding and all questions answered.    Copied from CRM [id number]. Topic: Clinical - Red Word Triage >> Mar 26, 2024  9:02 AM Hamp Levine R wrote: [facility] that prompted transfer to Nurse Triage: Patient states since the first of the year she has been having some issues with her right knee. Has pain and discomfort and difficulty walking at times and has been waking her up at night. States recently the pain has gotten worse. Reason for Disposition  [1] MODERATE pain (e.g., interferes with normal activities, limping) AND [2] present > 3 days  Answer Assessment - Initial Assessment Questions 1. LOCATION and RADIATION: "Where is the pain located?"      Right knee 2. QUALITY: "What does the pain feel like?"  (e.g., sharp, dull, aching, burning)     Sharp pain that can be dull and aching but pain is worse at night 3. SEVERITY: "How bad is the pain?" "What does it keep you from doing?"   (Scale 1-10;  or mild, moderate, severe)   -  MILD (1-3): doesn't interfere with normal activities    -  MODERATE (4-7): interferes with normal activities (e.g., work or school) or awakens from sleep, limping    -  SEVERE (8-10): excruciating pain, unable to do any normal activities, unable to walk     No pain currently-pain hurts the worst 4. ONSET: "When did the pain start?" "Does it come and go, or is it there all the time?"     Going on since the beginning of the year but pain has gotten progressively worse 5. RECURRENT: "Have you had this pain before?" If Yes, ask: "When, and what happened then?"     yes 6. SETTING: "Has there been any recent work, exercise or other activity that involved that part of the body?"      Recent cleaning out of her basement-having to go up and down steps 7. AGGRAVATING FACTORS: "What makes the knee pain worse?" (e.g., walking, climbing stairs, running)     walking 8. ASSOCIATED SYMPTOMS: "Is there any swelling or redness of the knee?"     Patient reports no swelling or redness 9. OTHER SYMPTOMS: "Do you have any other symptoms?" (e.g., chest pain, difficulty breathing, fever, calf pain)     no  Protocols used: Knee Pain-A-AH

## 2024-03-26 NOTE — Progress Notes (Signed)
 Acute Office Visit  Subjective:     Patient ID: Katherine Murphy, female    DOB: 12/09/1970, 53 y.o.   MRN: [id number]  Chief Complaint  Patient presents with   Knee Pain    Patient states that her knees started hurting back in January , but since then her right knee continues to give her problems, she states that she has sharp pains in her knee, sometimes hurts when she puts weight on it, she also states that it painful when going up a staircase..    Knee Pain   Patient is in today for ACUTE VISIT.  Pt reports she was cleaning out of her basement in January 2025. Going up and down stairs makes it worse. She reports the pain is worse with sleeping. Walking sometimes makes the pain worse but not all the time. The knee pain doesn't swell and no popping. She has tried Aleve that didn't help along with Tylenol 500 mg with Advil sometimes help.   Pt reports her psoriasis has flared up since January. She doesn't know if it's from one of the medications. She hasn't seen a rheumatologist.  She has hx of HTN but stopped the Amlodipine  due to leg swelling. She is also taking Losartan  50mg  daily. She never told me she stopped the Amlodipine . BP high today.    Review of Systems  Musculoskeletal:  Positive for joint pain.  Skin:  Positive for rash.  All other systems reviewed and are negative.       Objective:    BP (!) 165/95   Pulse 88   Temp 98.1 F (36.7 C) (Oral)   Resp 18   Ht 5\' 8"  (1.727 m)   Wt 286 lb 8 oz (130 kg)   SpO2 97%   BMI 43.56 kg/m    Physical Exam Vitals and nursing note reviewed.  Constitutional:      Appearance: Normal appearance. She is obese.  HENT:     Head: Normocephalic and atraumatic.     Right Ear: External ear normal.     Left Ear: External ear normal.     Nose: Nose normal.     Mouth/Throat:     Mouth: Mucous membranes are moist.     Pharynx: Oropharynx is clear.  Eyes:     Conjunctiva/sclera: Conjunctivae normal.     Pupils: Pupils are  equal, round, and reactive to light.  Cardiovascular:     Rate and Rhythm: Normal rate.  Pulmonary:     Effort: Pulmonary effort is normal.  Musculoskeletal:        General: Normal range of motion.     Comments: Popping with extension and flexion to right knee with  moderate crepitus  Skin:    General: Skin is warm.     Capillary Refill: Capillary refill takes less than 2 seconds.  Neurological:     General: No focal deficit present.     Mental Status: She is alert and oriented to person, place, and time. Mental status is at baseline.  Psychiatric:        Mood and Affect: Mood normal.        Behavior: Behavior normal.        Thought Content: Thought content normal.        Judgment: Judgment normal.    No results found for any visits on 03/26/24.      Assessment & Plan:   Problem List Items Addressed This Visit       Musculoskeletal and  Integument   Psoriasis   Relevant Medications   methylPREDNISolone  (MEDROL  DOSEPAK) 4 MG TBPK tablet   Other Relevant Orders   Ambulatory referral to Rheumatology   Other Visit Diagnoses       Chronic pain of right knee    -  Primary   Relevant Medications   meloxicam (MOBIC) 15 MG tablet   Other Relevant Orders   DG Knee Complete 4 Views Right     Primary hypertension       Relevant Medications   losartan  (COZAAR ) 100 MG tablet       Meds ordered this encounter  Medications   meloxicam (MOBIC) 15 MG tablet    Sig: Take 1 tablet (15 mg total) by mouth daily as needed for pain.    Dispense:  30 tablet    Refill:  1   methylPREDNISolone  (MEDROL  DOSEPAK) 4 MG TBPK tablet    Sig: 6-day pack as directed    Dispense:  21 tablet    Refill:  0   losartan  (COZAAR ) 100 MG tablet    Sig: Take 1 tablet (100 mg total) by mouth daily.    Dispense:  90 tablet    Refill:  1   Chronic pain of right knee -     Meloxicam; Take 1 tablet (15 mg total) by mouth daily as needed for pain.  Dispense: 30 tablet; Refill: 1 -     DG Knee  Complete 4 Views Right; Future  Primary hypertension -     Losartan  Potassium; Take 1 tablet (100 mg total) by mouth daily.  Dispense: 90 tablet; Refill: 1  Psoriasis -     methylPREDNISolone ; 6-day pack as directed  Dispense: 21 tablet; Refill: 0 -     Ambulatory referral to Rheumatology   Pt with chronic right knee pain since January worsening. Otc medicines not working. Send in Mobic 15 mg daily prn to try. Pt with psoriasis flare since January, may be related ie psoriatic arthritis. Hasn't seen Rheumatology. To refer for further evaluation and treatment. For now, sent in medrol  dose pack.  Pt with uncontrolled HTN since stopping Amlodipine . To increase Losartan  from 50mg  to 100mg  to take 2 of the 50mg  and sent in new rx of 100mg .  See back in 4 weeks HTN follow up. Return in about 4 weeks (around 04/23/2024) for Hypertension.  Manette Section, MD

## 2024-03-30 ENCOUNTER — Ambulatory Visit: Payer: Self-pay | Admitting: Family Medicine

## 2024-03-30 DIAGNOSIS — L409 Psoriasis, unspecified: Secondary | ICD-10-CM

## 2024-03-30 DIAGNOSIS — G8929 Other chronic pain: Secondary | ICD-10-CM

## 2024-03-31 NOTE — Addendum Note (Signed)
 Addended by: RUCKER, Trevontae Lindahl Y on: 03/31/2024 09:18 AM   Modules accepted: Orders

## 2024-03-31 NOTE — Telephone Encounter (Signed)
 Thank you :)

## 2024-04-20 ENCOUNTER — Telehealth: Payer: Self-pay | Admitting: Obstetrics and Gynecology

## 2024-04-20 NOTE — Telephone Encounter (Signed)
 Please contact patient to remind her to complete the Cologuard testing.   She came up in my reminder box in Epic.

## 2024-04-22 NOTE — Telephone Encounter (Signed)
 Call to patient, left detailed message, ok per DPR. Advised as seen below per Dr. Colvin Dec. Return call to office at [phone], option 4. To provide update.

## 2024-05-06 DIAGNOSIS — M25561 Pain in right knee: Secondary | ICD-10-CM | POA: Diagnosis not present

## 2024-05-06 DIAGNOSIS — M2550 Pain in unspecified joint: Secondary | ICD-10-CM | POA: Diagnosis not present

## 2024-05-06 DIAGNOSIS — M1990 Unspecified osteoarthritis, unspecified site: Secondary | ICD-10-CM | POA: Diagnosis not present

## 2024-05-06 DIAGNOSIS — L409 Psoriasis, unspecified: Secondary | ICD-10-CM | POA: Diagnosis not present

## 2024-05-22 ENCOUNTER — Other Ambulatory Visit: Payer: Self-pay | Admitting: Family Medicine

## 2024-05-22 DIAGNOSIS — G8929 Other chronic pain: Secondary | ICD-10-CM

## 2024-06-03 ENCOUNTER — Other Ambulatory Visit: Payer: Self-pay | Admitting: Family Medicine

## 2024-06-03 DIAGNOSIS — K219 Gastro-esophageal reflux disease without esophagitis: Secondary | ICD-10-CM

## 2024-06-08 ENCOUNTER — Encounter: Payer: Self-pay | Admitting: Family Medicine

## 2024-06-08 DIAGNOSIS — R41840 Attention and concentration deficit: Secondary | ICD-10-CM

## 2024-06-08 MED ORDER — BUPROPION HCL ER (XL) 300 MG PO TB24: 300.0000 mg | ORAL_TABLET | Freq: Every day | ORAL | 1 refills | Status: AC

## 2024-07-11 ENCOUNTER — Other Ambulatory Visit: Payer: Self-pay | Admitting: Family Medicine

## 2024-07-11 DIAGNOSIS — R053 Chronic cough: Secondary | ICD-10-CM

## 2024-07-11 DIAGNOSIS — J309 Allergic rhinitis, unspecified: Secondary | ICD-10-CM

## 2024-07-21 ENCOUNTER — Other Ambulatory Visit: Payer: Self-pay | Admitting: Family Medicine

## 2024-07-21 DIAGNOSIS — G8929 Other chronic pain: Secondary | ICD-10-CM

## 2024-08-06 DIAGNOSIS — M25561 Pain in right knee: Secondary | ICD-10-CM | POA: Diagnosis not present

## 2024-08-06 DIAGNOSIS — M1990 Unspecified osteoarthritis, unspecified site: Secondary | ICD-10-CM | POA: Diagnosis not present

## 2024-08-06 DIAGNOSIS — M2550 Pain in unspecified joint: Secondary | ICD-10-CM | POA: Diagnosis not present

## 2024-08-06 DIAGNOSIS — L409 Psoriasis, unspecified: Secondary | ICD-10-CM | POA: Diagnosis not present

## 2024-08-09 ENCOUNTER — Other Ambulatory Visit: Payer: Self-pay | Admitting: Rheumatology

## 2024-08-09 DIAGNOSIS — M25561 Pain in right knee: Secondary | ICD-10-CM

## 2024-08-23 ENCOUNTER — Encounter: Payer: Self-pay | Admitting: Rheumatology

## 2024-08-25 ENCOUNTER — Ambulatory Visit
Admission: RE | Admit: 2024-08-25 | Discharge: 2024-08-25 | Disposition: A | Discharge status: Home / Self Care | Source: Ambulatory Visit | Attending: Rheumatology | Admitting: Rheumatology

## 2024-08-25 DIAGNOSIS — M25561 Pain in right knee: Secondary | ICD-10-CM

## 2024-08-25 DIAGNOSIS — M23221 Derangement of posterior horn of medial meniscus due to old tear or injury, right knee: Secondary | ICD-10-CM | POA: Diagnosis not present

## 2024-09-07 DIAGNOSIS — S83221A Peripheral tear of medial meniscus, current injury, right knee, initial encounter: Secondary | ICD-10-CM | POA: Diagnosis not present

## 2024-09-19 ENCOUNTER — Other Ambulatory Visit: Payer: Self-pay | Admitting: Family Medicine

## 2024-09-19 DIAGNOSIS — G8929 Other chronic pain: Secondary | ICD-10-CM

## 2024-10-25 ENCOUNTER — Other Ambulatory Visit: Payer: Self-pay | Admitting: Family Medicine

## 2024-10-25 DIAGNOSIS — I10 Essential (primary) hypertension: Secondary | ICD-10-CM

## 2024-11-18 ENCOUNTER — Other Ambulatory Visit: Payer: Self-pay | Admitting: Family Medicine

## 2024-11-18 DIAGNOSIS — I10 Essential (primary) hypertension: Secondary | ICD-10-CM

## 2024-11-19 ENCOUNTER — Other Ambulatory Visit: Payer: Self-pay | Admitting: Family Medicine

## 2024-11-19 DIAGNOSIS — K219 Gastro-esophageal reflux disease without esophagitis: Secondary | ICD-10-CM

## 2024-11-19 DIAGNOSIS — I10 Essential (primary) hypertension: Secondary | ICD-10-CM

## 2024-11-20 ENCOUNTER — Other Ambulatory Visit: Payer: Self-pay | Admitting: Family Medicine

## 2024-11-20 DIAGNOSIS — G8929 Other chronic pain: Secondary | ICD-10-CM

## 2025-01-20 ENCOUNTER — Ambulatory Visit: Admitting: Obstetrics and Gynecology
